# Patient Record
Sex: Male | Born: 1970 | Race: White | Hispanic: No | Marital: Married | Smoking: Never smoker
Health system: Southern US, Community
[De-identification: ages and names within clinical notes are randomized; demographics above are authoritative.]

## PROBLEM LIST (undated history)

## (undated) DIAGNOSIS — K469 Unspecified abdominal hernia without obstruction or gangrene: Secondary | ICD-10-CM

## (undated) DIAGNOSIS — I10 Essential (primary) hypertension: Secondary | ICD-10-CM

## (undated) HISTORY — DX: Unspecified abdominal hernia without obstruction or gangrene: K46.9

## (undated) HISTORY — DX: Essential (primary) hypertension: I10

## (undated) HISTORY — PX: OTHER SURGICAL HISTORY: SHX169

---

## 2017-02-05 ENCOUNTER — Encounter: Payer: Self-pay | Admitting: Nurse Practitioner

## 2017-02-05 ENCOUNTER — Ambulatory Visit (INDEPENDENT_AMBULATORY_CARE_PROVIDER_SITE_OTHER): Payer: 59 | Admitting: Nurse Practitioner

## 2017-02-05 VITALS — BP 120/80 | HR 58 | Temp 97.8°F | Ht 70.5 in | Wt 207.0 lb

## 2017-02-05 DIAGNOSIS — I10 Essential (primary) hypertension: Secondary | ICD-10-CM

## 2017-02-05 DIAGNOSIS — H539 Unspecified visual disturbance: Secondary | ICD-10-CM

## 2017-02-05 DIAGNOSIS — Z0000 Encounter for general adult medical examination without abnormal findings: Secondary | ICD-10-CM

## 2017-02-05 DIAGNOSIS — Z13220 Encounter for screening for lipoid disorders: Secondary | ICD-10-CM

## 2017-02-05 DIAGNOSIS — Z136 Encounter for screening for cardiovascular disorders: Secondary | ICD-10-CM | POA: Diagnosis not present

## 2017-02-05 MED ORDER — LISINOPRIL 10 MG PO TABS: 10.0000 mg | ORAL_TABLET | Freq: Every day | ORAL | 1 refills | Status: DC

## 2017-02-05 NOTE — Progress Notes (Signed)
Subjective:    Patient ID: Roy Regal., male    DOB: Apr 20, 1971, 46 y.o.   MRN: [id number]  Patient presents today for complete physical (new patient)   Eye Problem   The right (he has experienced transient episodes of vision loss, last about 5seconds,.) eye is affected. This is a recurrent problem. The current episode started more than 1 month ago. The problem occurs intermittently. The problem has been waxing and waning. There was no injury mechanism. There is no known exposure to pink eye. He does not wear contacts. Pertinent negatives include no blurred vision, eye discharge, double vision, eye redness, fever, foreign body sensation, itching, nausea, photophobia, recent URI or vomiting. He has tried nothing for the symptoms.  no FHX of blindness or macular degeneration.  Married Moved from [place]. Previous pcp in [place] was Dr. Simona Huh. Last seen over 1year ago.  HTN: Current use of lisinopril. Stable  Immunizations: (TDAP, Hep C screen, Pneumovax, Influenza, zoster)  Health Maintenance  Topic Date Due  . HIV Screening  01/20/1984  . Tetanus Vaccine  01/20/1988  . Flu Shot  04/03/2017   Diet:regular.  Weight:  Wt Readings from Last 3 Encounters:  02/05/17 207 lb (93.9 kg)    Exercise:daily running, weight lifting.  Fall Risk: Fall Risk  02/05/2017  Falls in the past year? No   Home Safety:home with wife and children.  Depression/Suicide: Depression screen [facility] 2/9 02/05/2017  Decreased Interest 0  Down, Depressed, Hopeless 0  PHQ - 2 Score 0   No flowsheet data found. Vision:needed. Has intermittent vision loss in right eye. Hx of laser surgery.  Dental:needed, will schedule.  Sexual History (birth control, marital status, STD):married and sexually active.  Medications and allergies reviewed with patient and updated if appropriate.  Patient Active Problem List   Diagnosis Date Noted  . HTN (hypertension) 02/05/2017    No current outpatient prescriptions on  file prior to visit.   No current facility-administered medications on file prior to visit.     Past Medical History:  Diagnosis Date  . Hypertension     History reviewed. No pertinent surgical history.  Social History   Social History  . Marital status: Married    Spouse name: N/A  . Number of children: N/A  . Years of education: N/A   Social History Main Topics  . Smoking status: Never Smoker  . Smokeless tobacco: Never Used  . Alcohol use Yes     Comment: social  . Drug use: No  . Sexual activity: Yes   Other Topics Concern  . None   Social History Narrative  . None    Family History  Problem Relation Age of Onset  . Benign prostatic hyperplasia Father   . Aneurysm Maternal Grandmother   . Alcohol abuse Paternal Grandfather   . COPD Paternal Grandfather         Review of Systems  Constitutional: Negative for fever, malaise/fatigue and weight loss.  HENT: Negative for congestion and sore throat.   Eyes: Negative for blurred vision, double vision, photophobia, discharge, redness and itching.       Negative for visual changes  Respiratory: Negative for cough and shortness of breath.   Cardiovascular: Negative for chest pain, palpitations and leg swelling.  Gastrointestinal: Negative for blood in stool, constipation, diarrhea, heartburn, nausea and vomiting.  Genitourinary: Negative for dysuria, frequency and urgency.  Musculoskeletal: Negative for falls, joint pain and myalgias.  Skin: Negative for rash.  Neurological: Negative for dizziness,  sensory change, speech change, focal weakness, seizures, loss of consciousness and headaches.  Endo/Heme/Allergies: Does not bruise/bleed easily.  Psychiatric/Behavioral: Negative for depression, substance abuse and suicidal ideas. The patient is not nervous/anxious and does not have insomnia.     Objective:   Vitals:   02/05/17 0932  BP: 120/80  Pulse: (!) 58  Temp: 97.8 F (36.6 C)    Body mass index is  29.28 kg/m.  ECG: Sinus Bradycardia with early repolarization, no ST segment or T wave abnormality. No previous ECG to compare.  Physical Examination:  Physical Exam  Constitutional: He is oriented to person, place, and time and well-developed, well-nourished, and in no distress. No distress.  HENT:  Right Ear: External ear normal.  Left Ear: External ear normal.  Nose: Nose normal.  Mouth/Throat: Oropharynx is clear and moist. No oropharyngeal exudate.  Eyes: Conjunctivae and EOM are normal. Pupils are equal, round, and reactive to light. Right eye exhibits no discharge. Left eye exhibits no discharge. No scleral icterus.  Neck: Normal range of motion. Neck supple. No thyromegaly present.  Cardiovascular: Normal rate, regular rhythm and normal heart sounds.   Pulmonary/Chest: Effort normal and breath sounds normal. He exhibits no tenderness.  Abdominal: Soft. Bowel sounds are normal. He exhibits no distension. There is no tenderness.  Musculoskeletal: Normal range of motion. He exhibits no edema or tenderness.  Lymphadenopathy:    He has no cervical adenopathy.  Neurological: He is alert and oriented to person, place, and time. Gait normal.  Skin: Skin is warm and dry.  Psychiatric: Affect and judgment normal.  Vitals reviewed.   ASSESSMENT and PLAN:  Silus was seen today for establish care.  Diagnoses and all orders for this visit:  Preventative health care -     Comprehensive metabolic panel; Future -     TSH; Future -     CBC; Future -     Lipid panel; Future -     EKG 12-Lead  Essential hypertension -     lisinopril (PRINIVIL,ZESTRIL) 10 MG tablet; Take 1 tablet (10 mg total) by mouth daily.  Encounter for lipid screening for cardiovascular disease -     Lipid panel; Future  Transient vision disturbance of right eye -     Ambulatory referral to Ophthalmology   No problem-specific Assessment & Plan notes found for this encounter.     Follow up: Return if  symptoms worsen or fail to improve.  Wilfred Lacy, NP

## 2017-02-05 NOTE — Patient Instructions (Addendum)
Please sign medical release to get records from previous pcp.  Please return to lab fasting at least 6-8hrs prior to blood draw. You will be contacted with results.  You will contacted to schedule appt with opthalmology.

## 2017-02-19 ENCOUNTER — Encounter: Payer: Self-pay | Admitting: Nurse Practitioner

## 2017-02-19 ENCOUNTER — Other Ambulatory Visit: Payer: Self-pay | Admitting: Nurse Practitioner

## 2017-02-19 DIAGNOSIS — Z136 Encounter for screening for cardiovascular disorders: Secondary | ICD-10-CM

## 2017-02-19 DIAGNOSIS — I10 Essential (primary) hypertension: Secondary | ICD-10-CM

## 2017-02-19 DIAGNOSIS — H34211 Partial retinal artery occlusion, right eye: Secondary | ICD-10-CM | POA: Insufficient documentation

## 2017-02-19 DIAGNOSIS — H539 Unspecified visual disturbance: Secondary | ICD-10-CM

## 2017-02-21 ENCOUNTER — Telehealth: Payer: Self-pay | Admitting: Nurse Practitioner

## 2017-02-21 NOTE — Telephone Encounter (Signed)
Rec'd from [employer] forward 30 pages to [place] NP

## 2017-03-05 ENCOUNTER — Encounter: Payer: Self-pay | Admitting: *Deleted

## 2017-03-06 NOTE — Progress Notes (Deleted)
   Cardiology Office Note    Date:  03/06/2017   ID:  Roy Regal., DOB August 06, 1971, MRN [id number]  PCP:  Flossie Buffy, NP  Cardiologist:  ***   No chief complaint on file.   History of Present Illness:  Roy Pridgeon. is a 46 y.o. male with past medical history of hypertension who recently had transient vision loss. 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  Carotid US Yes EKG  Past Medical History:  Diagnosis Date  . Hypertension     No past surgical history on file.  Current Medications: Outpatient Medications Prior to Visit  Medication Sig Dispense Refill  . lisinopril (PRINIVIL,ZESTRIL) 10 MG tablet Take 1 tablet (10 mg total) by mouth daily. 90 tablet 1   No facility-administered medications prior to visit.      Allergies:   Patient has no known allergies.   Social History   Social History  . Marital status: Married    Spouse name: N/A  . Number of children: N/A  . Years of education: N/A   Social History Main Topics  . Smoking status: Never Smoker  . Smokeless tobacco: Never Used  . Alcohol use Yes     Comment: social  . Drug use: No  . Sexual activity: Yes   Other Topics Concern  . Not on file   Social History Narrative  . No narrative on file     Family History:  The patient's ***family history includes Alcohol abuse in his paternal grandfather; Aneurysm in his maternal grandmother; Benign prostatic hyperplasia in his father; COPD in his paternal grandfather.   ROS:   Please see the history of present illness.    ROS All other systems reviewed and are negative.   PHYSICAL EXAM:   VS:  There were no vitals taken for this visit.   GEN: Well nourished, well developed, in no acute distress  HEENT: normal  Neck: no JVD, carotid bruits, or masses Cardiac: ***RRR; no murmurs, rubs, or gallops,no edema  Respiratory:  clear to auscultation bilaterally, normal work of breathing GI:  soft, nontender, nondistended, + BS MS: no deformity or atrophy  Skin: warm and dry, no rash Neuro:  Alert and Oriented x 3, Strength and sensation are intact Psych: euthymic mood, full affect  Wt Readings from Last 3 Encounters:  02/05/17 207 lb (93.9 kg)      Studies/Labs Reviewed:   EKG:  EKG is*** ordered today.  The ekg ordered today demonstrates ***  Recent Labs: No results found for requested labs within last 8760 hours.   Lipid Panel No results found for: CHOL, TRIG, HDL, CHOLHDL, VLDL, LDLCALC, LDLDIRECT  Additional studies/ records that were reviewed today include:  ***    ASSESSMENT:    No diagnosis found.   PLAN:  In order of problems listed above:  1. ***    Medication Adjustments/Labs and Tests Ordered: Current medicines are reviewed at length with the patient today.  Concerns regarding medicines are outlined above.  Medication changes, Labs and Tests ordered today are listed in the Patient Instructions below. There are no Patient Instructions on file for this visit.   Hilbert Corrigan, [place]  03/06/2017 10:34 PM    [facility] [address], [place], [place]  [address] Phone: [phone]; Fax: [phone]

## 2017-03-07 ENCOUNTER — Ambulatory Visit: Payer: 59 | Admitting: Physician Assistant

## 2017-03-07 ENCOUNTER — Ambulatory Visit (INDEPENDENT_AMBULATORY_CARE_PROVIDER_SITE_OTHER): Payer: 59 | Admitting: Cardiology

## 2017-03-07 ENCOUNTER — Encounter: Payer: Self-pay | Admitting: Cardiology

## 2017-03-07 ENCOUNTER — Encounter (INDEPENDENT_AMBULATORY_CARE_PROVIDER_SITE_OTHER): Payer: Self-pay

## 2017-03-07 VITALS — BP 144/72 | HR 67 | Ht 70.5 in | Wt 208.0 lb

## 2017-03-07 DIAGNOSIS — I10 Essential (primary) hypertension: Secondary | ICD-10-CM | POA: Diagnosis not present

## 2017-03-07 DIAGNOSIS — R9431 Abnormal electrocardiogram [ECG] [EKG]: Secondary | ICD-10-CM

## 2017-03-07 NOTE — Patient Instructions (Signed)
Medication Instructions:  Your physician recommends that you continue on your current medications as directed. Please refer to the Current Medication list given to you today.  Labwork: NONE  Testing/Procedures: Your physician has ordered a CT coronary calcium score. This test is done at [address].   Coronary CalciumScan A coronary calcium scan is an imaging test used to look for deposits of calcium and other fatty materials (plaques) in the inner lining of the blood vessels of the heart (coronary arteries). These deposits of calcium and plaques can partly clog and narrow the coronary arteries without producing any symptoms or warning signs. This puts a person at risk for a heart attack. This test can detect these deposits before symptoms develop. Tell a health care provider about:  Any allergies you have.  All medicines you are taking, including vitamins, herbs, eye drops, creams, and over-the-counter medicines.  Any problems you or family members have had with anesthetic medicines.  Any blood disorders you have.  Any surgeries you have had.  Any medical conditions you have.  Whether you are pregnant or may be pregnant. What are the risks? Generally, this is a safe procedure. However, problems may occur, including:  Harm to a pregnant woman and her unborn baby. This test involves the use of radiation. Radiation exposure can be dangerous to a pregnant woman and her unborn baby. If you are pregnant, you generally should not have this procedure done.  Slight increase in the risk of cancer. This is because of the radiation involved in the test. What happens before the procedure? No preparation is needed for this procedure. What happens during the procedure?  You will undress and remove any jewelry around your neck or chest.  You will put on a hospital gown.  Sticky electrodes will be placed on your chest. The electrodes will be connected to an  electrocardiogram (ECG) machine to record a tracing of the electrical activity of your heart.  A CT scanner will take pictures of your heart. During this time, you will be asked to lie still and hold your breath for 2-3 seconds while a picture of your heart is being taken. The procedure may vary among health care providers and hospitals. What happens after the procedure?  You can get dressed.  You can return to your normal activities.  It is up to you to get the results of your test. Ask your health care provider, or the department that is doing the test, when your results will be ready. Summary  A coronary calcium scan is an imaging test used to look for deposits of calcium and other fatty materials (plaques) in the inner lining of the blood vessels of the heart (coronary arteries).  Generally, this is a safe procedure. Tell your health care provider if you are pregnant or may be pregnant.  No preparation is needed for this procedure.  A CT scanner will take pictures of your heart.  You can return to your normal activities after the scan is done. This information is not intended to replace advice given to you by your health care provider. Make sure you discuss any questions you have with your health care provider. Document Released: 02/16/2008 Document Revised: 07/09/2016 Document Reviewed: 07/09/2016 Elsevier Interactive Patient Education  2017 [place]: AS NEEDED  Any Other Special Instructions Will Be Listed Below (If Applicable).     If you need a refill on your cardiac medications before your next appointment, please  call your pharmacy.

## 2017-03-07 NOTE — Progress Notes (Signed)
Cardiology Office Note   Date:  03/07/2017   ID:  [name]., DOB 13-May-1971, MRN [id number]  PCP:  Flossie Buffy, NP  Cardiologist:   Minus Breeding, MD  Referring:  Flossie Buffy, NP  Chief Complaint  Patient presents with  . Abnormal ECG      History of Present Illness: Roy Bauer. is a 46 y.o. male who presents for evaluation of an abnormal EKG.  He is referred by Flossie Buffy, NP.   He moved here from [place]. He works for the [employer] He has not had any prior cardiac history. He was noted to have an abnormal EKG with inferior Q waves and some voltage criteria for hypertrophy. However, have no old EKG for comparison. He has no symptoms. He doesn't report any prior cardiac testing. He exercises and actually ran 5 miles yesterday without symptoms. The patient denies any new symptoms such as chest discomfort, neck or arm discomfort. There has been no new shortness of breath, PND or orthopnea. There have been no reported palpitations, presyncope or syncope.    Past Medical History:  Diagnosis Date  . Hypertension     Past Surgical History:  Procedure Laterality Date  . None       Current Outpatient Prescriptions  Medication Sig Dispense Refill  . lisinopril (PRINIVIL,ZESTRIL) 10 MG tablet Take 1 tablet (10 mg total) by mouth daily. 90 tablet 1   No current facility-administered medications for this visit.     Allergies:   Patient has no known allergies.    Social History:  The patient  reports that he has never smoked. He has never used smokeless tobacco. He reports that he drinks alcohol. He reports that he does not use drugs.   Family History:  The patient's family history includes Alcohol abuse in his paternal grandfather; Aneurysm in his maternal grandmother; Benign prostatic hyperplasia in his father; COPD in his paternal grandfather; Heart disease in his mother.    ROS:  Please see the history of present illness.    Otherwise, review of systems are positive for insomnia.   All other systems are reviewed and negative.    PHYSICAL EXAM: VS:  BP (!) 144/72   Pulse 67   Ht 5' 10.5" (1.791 m)   Wt 208 lb (94.3 kg)   BMI 29.42 kg/m  , BMI Body mass index is 29.42 kg/m. GENERAL:  Well appearing HEENT:  Pupils equal round and reactive, fundi not visualized, oral mucosa unremarkable NECK:  No jugular venous distention, waveform within normal limits, carotid upstroke brisk and symmetric, no bruits, no thyromegaly LYMPHATICS:  No cervical, inguinal adenopathy LUNGS:  Clear to auscultation bilaterally BACK:  No CVA tenderness CHEST:  Unremarkable HEART:  PMI not displaced or sustained,S1 and S2 within normal limits, no S3, no S4, no clicks, no rubs, no murmurs ABD:  Flat, positive bowel sounds normal in frequency in pitch, no bruits, no rebound, no guarding, no midline pulsatile mass, no hepatomegaly, no splenomegaly EXT:  2 plus pulses throughout, no edema, no cyanosis no clubbing SKIN:  No rashes no nodules NEURO:  Cranial nerves II through XII grossly intact, motor grossly intact throughout PSYCH:  Cognitively intact, oriented to person place and time    EKG:  EKG is ordered today. The ekg ordered today demonstrates sinus rhythm, rate 67, axis within normal limits, intervals within normal limits, inferior and lateral Q waves not meeting criteria for pathologic Q waves, minimal voltage criteria  for LVH.   Recent Labs: No results found for requested labs within last 8760 hours.    Lipid Panel No results found for: CHOL, TRIG, HDL, CHOLHDL, VLDL, LDLCALC, LDLDIRECT    Wt Readings from Last 3 Encounters:  03/07/17 208 lb (94.3 kg)  02/05/17 207 lb (93.9 kg)      Other studies Reviewed: Additional studies/ records that were reviewed today include: None. Review of the above records demonstrates:  Please see elsewhere in the note.     ASSESSMENT AND PLAN:   HTN:  The blood pressure is at  target. No change in medications is indicated. We will continue with therapeutic lifestyle changes (TLC).  ABNORMAL EKG:  I think the pretest probability of obstructive coronary disease is low and I doubt that this represents significant pathology. He will get me old EKG for comparison. For screening purposes I will will order coronary calcium.  OVERWEIGHT:  His body mass index is slightly elevated and we discussed diet and exercise. I gave him specific instructions for this.    Current medicines are reviewed at length with the patient today.  The patient does not have concerns regarding medicines.  The following changes have been made:  no change  Labs/ tests ordered today include:   Orders Placed This Encounter  Procedures  . CT CARDIAC SCORING  . EKG 12-Lead     Disposition:   FU with me as needed.     Signed, Minus Breeding, MD  03/07/2017 9:24 PM    [place]

## 2017-03-13 ENCOUNTER — Ambulatory Visit (INDEPENDENT_AMBULATORY_CARE_PROVIDER_SITE_OTHER)
Admission: RE | Admit: 2017-03-13 | Discharge: 2017-03-13 | Disposition: A | Discharge status: Home / Self Care | Payer: Self-pay | Source: Ambulatory Visit | Attending: Cardiology | Admitting: Cardiology

## 2017-03-13 ENCOUNTER — Other Ambulatory Visit (INDEPENDENT_AMBULATORY_CARE_PROVIDER_SITE_OTHER): Payer: 59

## 2017-03-13 DIAGNOSIS — R9431 Abnormal electrocardiogram [ECG] [EKG]: Secondary | ICD-10-CM

## 2017-03-13 DIAGNOSIS — Z0000 Encounter for general adult medical examination without abnormal findings: Secondary | ICD-10-CM

## 2017-03-13 DIAGNOSIS — Z13220 Encounter for screening for lipoid disorders: Secondary | ICD-10-CM | POA: Diagnosis not present

## 2017-03-13 DIAGNOSIS — Z136 Encounter for screening for cardiovascular disorders: Secondary | ICD-10-CM | POA: Diagnosis not present

## 2017-03-13 LAB — COMPREHENSIVE METABOLIC PANEL
ALT: 22 U/L (ref 0–53)
AST: 20 U/L (ref 0–37)
Albumin: 4.5 g/dL (ref 3.5–5.2)
Alkaline Phosphatase: 39 U/L (ref 39–117)
BUN: 19 mg/dL (ref 6–23)
CHLORIDE: 103 meq/L (ref 96–112)
CO2: 27 meq/L (ref 19–32)
Calcium: 9.5 mg/dL (ref 8.4–10.5)
Creatinine, Ser: 1.23 mg/dL (ref 0.40–1.50)
GFR: 66.71 mL/min (ref >60.00)
GLUCOSE: 100 mg/dL — AB (ref 70–99)
POTASSIUM: 4 meq/L (ref 3.5–5.1)
SODIUM: 139 meq/L (ref 135–145)
TOTAL PROTEIN: 7.2 g/dL (ref 6.0–8.3)
Total Bilirubin: 0.7 mg/dL (ref 0.2–1.2)

## 2017-03-13 LAB — CBC
HEMATOCRIT: 44.8 % (ref 39.0–52.0)
HEMOGLOBIN: 15.4 g/dL (ref 13.0–17.0)
MCHC: 34.4 g/dL (ref 30.0–36.0)
MCV: 88 fl (ref 78.0–100.0)
PLATELETS: 324 10*3/uL (ref 150.0–400.0)
RBC: 5.09 Mil/uL (ref 4.22–5.81)
RDW: 12.4 % (ref 11.5–15.5)
WBC: 6.8 10*3/uL (ref 4.0–10.5)

## 2017-03-13 LAB — LIPID PANEL
CHOL/HDL RATIO: 4
Cholesterol: 166 mg/dL (ref 0–200)
HDL: 44 mg/dL (ref >39.00)
LDL CALC: 106 mg/dL — AB (ref 0–99)
NONHDL: 121.67
Triglycerides: 76 mg/dL (ref 0.0–149.0)
VLDL: 15.2 mg/dL (ref 0.0–40.0)

## 2017-03-13 LAB — TSH: TSH: 4.7 u[IU]/mL — ABNORMAL HIGH (ref 0.35–4.50)

## 2017-03-14 ENCOUNTER — Telehealth: Payer: Self-pay | Admitting: Nurse Practitioner

## 2017-03-14 NOTE — Telephone Encounter (Signed)
Pt would like results from yesterdays lab work

## 2017-03-15 NOTE — Telephone Encounter (Signed)
Pt is aware.  

## 2017-09-23 ENCOUNTER — Other Ambulatory Visit: Payer: Self-pay | Admitting: Nurse Practitioner

## 2017-09-23 DIAGNOSIS — I10 Essential (primary) hypertension: Secondary | ICD-10-CM

## 2018-01-06 DIAGNOSIS — T7840XA Allergy, unspecified, initial encounter: Secondary | ICD-10-CM | POA: Diagnosis not present

## 2018-01-06 DIAGNOSIS — J029 Acute pharyngitis, unspecified: Secondary | ICD-10-CM | POA: Diagnosis not present

## 2018-01-06 DIAGNOSIS — R05 Cough: Secondary | ICD-10-CM | POA: Diagnosis not present

## 2018-03-14 ENCOUNTER — Encounter: Payer: Self-pay | Admitting: Nurse Practitioner

## 2018-03-14 ENCOUNTER — Ambulatory Visit (INDEPENDENT_AMBULATORY_CARE_PROVIDER_SITE_OTHER): Payer: 59 | Admitting: Nurse Practitioner

## 2018-03-14 VITALS — BP 112/84 | HR 64 | Temp 97.4°F | Ht 70.5 in | Wt 205.0 lb

## 2018-03-14 DIAGNOSIS — I10 Essential (primary) hypertension: Secondary | ICD-10-CM | POA: Diagnosis not present

## 2018-03-14 DIAGNOSIS — Z13220 Encounter for screening for lipoid disorders: Secondary | ICD-10-CM

## 2018-03-14 DIAGNOSIS — S39012A Strain of muscle, fascia and tendon of lower back, initial encounter: Secondary | ICD-10-CM

## 2018-03-14 DIAGNOSIS — Z136 Encounter for screening for cardiovascular disorders: Secondary | ICD-10-CM

## 2018-03-14 DIAGNOSIS — Z23 Encounter for immunization: Secondary | ICD-10-CM | POA: Diagnosis not present

## 2018-03-14 DIAGNOSIS — Z0000 Encounter for general adult medical examination without abnormal findings: Secondary | ICD-10-CM | POA: Diagnosis not present

## 2018-03-14 DIAGNOSIS — Z1211 Encounter for screening for malignant neoplasm of colon: Secondary | ICD-10-CM

## 2018-03-14 LAB — CBC
HEMATOCRIT: 48.9 % (ref 39.0–52.0)
HEMOGLOBIN: 16.5 g/dL (ref 13.0–17.0)
MCHC: 33.8 g/dL (ref 30.0–36.0)
MCV: 87.1 fl (ref 78.0–100.0)
Platelets: 310 10*3/uL (ref 150.0–400.0)
RBC: 5.62 Mil/uL (ref 4.22–5.81)
RDW: 13.1 % (ref 11.5–15.5)
WBC: 10.6 10*3/uL — AB (ref 4.0–10.5)

## 2018-03-14 LAB — COMPREHENSIVE METABOLIC PANEL
ALT: 33 U/L (ref 0–53)
AST: 28 U/L (ref 0–37)
Albumin: 4.9 g/dL (ref 3.5–5.2)
Alkaline Phosphatase: 47 U/L (ref 39–117)
BILIRUBIN TOTAL: 1.6 mg/dL — AB (ref 0.2–1.2)
BUN: 16 mg/dL (ref 6–23)
CALCIUM: 9.9 mg/dL (ref 8.4–10.5)
CHLORIDE: 100 meq/L (ref 96–112)
CO2: 27 meq/L (ref 19–32)
Creatinine, Ser: 1.27 mg/dL (ref 0.40–1.50)
GFR: 64.02 mL/min (ref >60.00)
GLUCOSE: 99 mg/dL (ref 70–99)
POTASSIUM: 4.4 meq/L (ref 3.5–5.1)
Sodium: 139 mEq/L (ref 135–145)
Total Protein: 7.8 g/dL (ref 6.0–8.3)

## 2018-03-14 LAB — LIPID PANEL
CHOL/HDL RATIO: 4
Cholesterol: 191 mg/dL (ref 0–200)
HDL: 44.7 mg/dL (ref >39.00)
LDL CALC: 124 mg/dL — AB (ref 0–99)
NONHDL: 146.72
TRIGLYCERIDES: 116 mg/dL (ref 0.0–149.0)
VLDL: 23.2 mg/dL (ref 0.0–40.0)

## 2018-03-14 LAB — TSH: TSH: 2.39 u[IU]/mL (ref 0.35–4.50)

## 2018-03-14 LAB — IFOBT (OCCULT BLOOD): IFOBT: NEGATIVE

## 2018-03-14 MED ORDER — LISINOPRIL 10 MG PO TABS: 10.0000 mg | ORAL_TABLET | Freq: Every day | ORAL | 3 refills | Status: DC

## 2018-03-14 NOTE — Patient Instructions (Addendum)
Lipid panel indicates elevation in LDL. This can improve with heart healthy diet and regular exercise. Normal CMP, TSH, and CBC. Use tylenol or ibuprofen for back pain. Stretch before and after exercise, Maintain proper body mechanics to minimize injury. Return to office if back pain worsens or if develops any new symptoms.   Health Maintenance, Male A healthy lifestyle and preventive care is important for your health and wellness. Ask your health care provider about what schedule of regular examinations is right for you. What should I know about weight and diet? Eat a Healthy Diet  Eat plenty of vegetables, fruits, whole grains, low-fat dairy products, and lean protein.  Do not eat a lot of foods high in solid fats, added sugars, or salt.  Maintain a Healthy Weight Regular exercise can help you achieve or maintain a healthy weight. You should:  Do at least 150 minutes of exercise each week. The exercise should increase your heart rate and make you sweat (moderate-intensity exercise).  Do strength-training exercises at least twice a week.  Watch Your Levels of Cholesterol and Blood Lipids  Have your blood tested for lipids and cholesterol every 5 years starting at 49 years of age. If you are at high risk for heart disease, you should start having your blood tested when you are 49 years old. You may need to have your cholesterol levels checked more often if: ? Your lipid or cholesterol levels are high. ? You are older than 49 years of age. ? You are at high risk for heart disease.  What should I know about cancer screening? Many types of cancers can be detected early and may often be prevented. Lung Cancer  You should be screened every year for lung cancer if: ? You are a current smoker who has smoked for at least 30 years. ? You are a former smoker who has quit within the past 15 years.  Talk to your health care provider about your screening options, when you should start  screening, and how often you should be screened.  Colorectal Cancer  Routine colorectal cancer screening usually begins at 49 years of age and should be repeated every 5-10 years until you are 49 years old. You may need to be screened more often if early forms of precancerous polyps or small growths are found. Your health care provider may recommend screening at an earlier age if you have risk factors for colon cancer.  Your health care provider may recommend using home test kits to check for hidden blood in the stool.  A small camera at the end of a tube can be used to examine your colon (sigmoidoscopy or colonoscopy). This checks for the earliest forms of colorectal cancer.  Prostate and Testicular Cancer  Depending on your age and overall health, your health care provider may do certain tests to screen for prostate and testicular cancer.  Talk to your health care provider about any symptoms or concerns you have about testicular or prostate cancer.  Skin Cancer  Check your skin from head to toe regularly.  Tell your health care provider about any new moles or changes in moles, especially if: ? There is a change in a mole's size, shape, or color. ? You have a mole that is larger than a pencil eraser.  Always use sunscreen. Apply sunscreen liberally and repeat throughout the day.  Protect yourself by wearing long sleeves, pants, a wide-brimmed hat, and sunglasses when outside.  What should I know about heart disease, diabetes,  and high blood pressure?  If you are 61-13 years of age, have your blood pressure checked every 3-5 years. If you are 51 years of age or older, have your blood pressure checked every year. You should have your blood pressure measured twice-once when you are at a hospital or clinic, and once when you are not at a hospital or clinic. Record the average of the two measurements. To check your blood pressure when you are not at a hospital or clinic, you can use: ? An  automated blood pressure machine at a pharmacy. ? A home blood pressure monitor.  Talk to your health care provider about your target blood pressure.  If you are between 78-72 years old, ask your health care provider if you should take aspirin to prevent heart disease.  Have regular diabetes screenings by checking your fasting blood sugar level. ? If you are at a normal weight and have a low risk for diabetes, have this test once every three years after the age of 82. ? If you are overweight and have a high risk for diabetes, consider being tested at a younger age or more often.  A one-time screening for abdominal aortic aneurysm (AAA) by ultrasound is recommended for men aged 6-75 years who are current or former smokers. What should I know about preventing infection? Hepatitis B If you have a higher risk for hepatitis B, you should be screened for this virus. Talk with your health care provider to find out if you are at risk for hepatitis B infection. Hepatitis C Blood testing is recommended for:  Everyone born from [birth year] through 1967.  Anyone with known risk factors for hepatitis C.  Sexually Transmitted Diseases (STDs)  You should be screened each year for STDs including gonorrhea and chlamydia if: ? You are sexually active and are younger than 49 years of age. ? You are older than 49 years of age and your health care provider tells you that you are at risk for this type of infection. ? Your sexual activity has changed since you were last screened and you are at an increased risk for chlamydia or gonorrhea. Ask your health care provider if you are at risk.  Talk with your health care provider about whether you are at high risk of being infected with HIV. Your health care provider may recommend a prescription medicine to help prevent HIV infection.  What else can I do?  Schedule regular health, dental, and eye exams.  Stay current with your vaccines (immunizations).  Do not use  any tobacco products, such as cigarettes, chewing tobacco, and e-cigarettes. If you need help quitting, ask your health care provider.  Limit alcohol intake to no more than 2 drinks per day. One drink equals 12 ounces of beer, 5 ounces of wine, or 1 ounces of hard liquor.  Do not use street drugs.  Do not share needles.  Ask your health care provider for help if you need support or information about quitting drugs.  Tell your health care provider if you often feel depressed.  Tell your health care provider if you have ever been abused or do not feel safe at home. This information is not intended to replace advice given to you by your health care provider. Make sure you discuss any questions you have with your health care provider. Document Released: 02/16/2008 Document Revised: 04/18/2016 Document Reviewed: 05/24/2015 Elsevier Interactive Patient Education  [employer].

## 2018-03-14 NOTE — Progress Notes (Signed)
Subjective:    Patient ID: Roy Regal., male    DOB: 19-Mar-1971, 47 y.o.   MRN: [id number]  Patient presents today for complete physical   Back Pain  This is a new problem. The current episode started 1 to 4 weeks ago. The problem occurs intermittently. The problem has been waxing and waning since onset. The pain is present in the lumbar spine. The quality of the pain is described as aching. The pain does not radiate. Pertinent negatives include no abdominal pain, bladder incontinence, bowel incontinence, chest pain, dysuria, fever, headaches, leg pain, numbness, paresis, paresthesias, pelvic pain, perianal numbness, tingling, weakness or weight loss. He has tried nothing for the symptoms.   HTN: Controlled with lisinopril. BP Readings from Last 3 Encounters:  03/14/18 112/84  03/07/17 (!) 144/72  02/05/17 120/80   Immunizations: (TDAP, Hep C screen, Pneumovax, Influenza, zoster)  Health Maintenance  Topic Date Due  . Tetanus Vaccine  01/20/1988  . HIV Screening  03/15/2019*  . Flu Shot  04/03/2018  *Topic was postponed. The date shown is not the original due date.   Diet:regular due to extensive traveling for work.  Weight:  Wt Readings from Last 3 Encounters:  03/14/18 205 lb (93 kg)  03/07/17 208 lb (94.3 kg)  02/05/17 207 lb (93.9 kg)   Exercise:2-3times a week, not consistent when traveling.  Fall Risk: Fall Risk  03/14/2018 02/05/2017  Falls in the past year? No No   Home Safety:home with wife and children  Depression/Suicide: Depression screen [facility] 2/9 03/14/2018 02/05/2017  Decreased Interest 0 0  Down, Depressed, Hopeless 0 0  PHQ - 2 Score 0 0   Vision:up to date.  Dental:up to date.  Medications and allergies reviewed with patient and updated if appropriate.  Patient Active Problem List   Diagnosis Date Noted  . Abnormal EKG 03/07/2017  . 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 Transient vision disturbance of right eye 02/19/2017  . HTN  (hypertension) 02/05/2017    No current outpatient medications on file prior to visit.   No current facility-administered medications on file prior to visit.     Past Medical History:  Diagnosis Date  . Hypertension     Past Surgical History:  Procedure Laterality Date  . None      Social History   Socioeconomic History  . Marital status: Married    Spouse name: Not on file  . Number of children: 3  . Years of education: Not on file  . Highest education level: Not on file  Occupational History  . Not on file  Social Needs  . Financial resource strain: Not on file  . Food insecurity:    Worry: Not on file    Inability: Not on file  . Transportation needs:    Medical: Not on file    Non-medical: Not on file  Tobacco Use  . Smoking status: Never Smoker  . Smokeless tobacco: Never Used  Substance and Sexual Activity  . Alcohol use: Yes    Comment: social  . Drug use: No  . Sexual activity: Yes    Birth control/protection: None  Lifestyle  . Physical activity:    Days per week: Not on file    Minutes per session: Not on file  . Stress: Not on file  Relationships  . Social connections:    Talks on phone: More than three times a week    Gets together: Three times a week    Attends religious  service: Not on file    Active member of club or organization: Yes    Attends meetings of clubs or organizations: 1 to 4 times per year    Relationship status: Married  Other Topics Concern  . Not on file  Social History Narrative  . Not on file    Family History  Problem Relation Age of Onset  . Heart disease Mother        Valve surgery for SBE  . Benign prostatic hyperplasia Father   . Aneurysm Maternal Grandmother   . Alcohol abuse Paternal Grandfather   . COPD Paternal Grandfather         Review of Systems  Constitutional: Negative for fever, malaise/fatigue and weight loss.  HENT: Negative.   Respiratory: Negative.   Cardiovascular: Negative.  Negative  for chest pain.  Gastrointestinal: Negative.  Negative for abdominal pain and bowel incontinence.  Genitourinary: Negative.  Negative for bladder incontinence, dysuria and pelvic pain.  Musculoskeletal: Positive for back pain.  Skin: Negative.   Neurological: Negative for tingling, sensory change, focal weakness, weakness, numbness, headaches and paresthesias.  Endo/Heme/Allergies: Negative.   Psychiatric/Behavioral: Negative.     Objective:   Vitals:   03/14/18 0842  BP: 112/84  Pulse: 64  Temp: (!) 97.4 F (36.3 C)  SpO2: 96%    Body mass index is 29 kg/m.   Physical Examination:  Physical Exam  Constitutional: He is oriented to person, place, and time. He appears well-developed and well-nourished.  HENT:  Right Ear: External ear normal.  Left Ear: External ear normal.  Nose: Nose normal.  Mouth/Throat: Oropharynx is clear and moist. No oropharyngeal exudate.  Eyes: Pupils are equal, round, and reactive to light. Conjunctivae and EOM are normal.  Neck: Normal range of motion. Neck supple. No thyromegaly present.  Cardiovascular: Normal rate, regular rhythm and normal heart sounds.  Pulmonary/Chest: Effort normal and breath sounds normal. No respiratory distress. He exhibits no tenderness.  Abdominal: Soft. Bowel sounds are normal. He exhibits no distension. There is no tenderness.  Genitourinary: Rectum normal and prostate normal. Rectal exam shows guaiac negative stool.  Musculoskeletal: Normal range of motion. He exhibits no edema, tenderness or deformity.  Lymphadenopathy:    He has no cervical adenopathy.  Neurological: He is alert and oriented to person, place, and time. He has normal reflexes.  Vitals reviewed.  ASSESSMENT and PLAN:  Hoa was seen today for annual exam and back pain.  Diagnoses and all orders for this visit:  Preventative health care -     CBC -     TSH -     Lipid panel -     Comprehensive metabolic panel -     IFOBT POC (occult bld,  rslt in office)  Colon cancer screening -     IFOBT POC (occult bld, rslt in office)  Encounter for lipid screening for cardiovascular disease -     Lipid panel  Need for diphtheria-tetanus-pertussis (Tdap) vaccine -     Tdap vaccine greater than or equal to 7yo IM  Essential hypertension -     lisinopril (PRINIVIL,ZESTRIL) 10 MG tablet; Take 1 tablet (10 mg total) by mouth daily. Need office visit for additional refills  Strain of lumbar paraspinous muscle, initial encounter   No problem-specific Assessment & Plan notes found for this encounter.     Follow up: Return if symptoms worsen or fail to improve.  Wilfred Lacy, NP

## 2019-02-13 ENCOUNTER — Other Ambulatory Visit: Payer: Self-pay

## 2019-02-13 ENCOUNTER — Encounter: Payer: Self-pay | Admitting: Nurse Practitioner

## 2019-02-13 ENCOUNTER — Encounter: Payer: Self-pay | Admitting: Gastroenterology

## 2019-02-13 ENCOUNTER — Ambulatory Visit (INDEPENDENT_AMBULATORY_CARE_PROVIDER_SITE_OTHER): Payer: 59 | Admitting: Nurse Practitioner

## 2019-02-13 VITALS — BP 140/82 | HR 74 | Temp 98.6°F | Ht 70.5 in | Wt 181.8 lb

## 2019-02-13 DIAGNOSIS — Z136 Encounter for screening for cardiovascular disorders: Secondary | ICD-10-CM

## 2019-02-13 DIAGNOSIS — Z125 Encounter for screening for malignant neoplasm of prostate: Secondary | ICD-10-CM | POA: Diagnosis not present

## 2019-02-13 DIAGNOSIS — I10 Essential (primary) hypertension: Secondary | ICD-10-CM | POA: Diagnosis not present

## 2019-02-13 DIAGNOSIS — Z1211 Encounter for screening for malignant neoplasm of colon: Secondary | ICD-10-CM

## 2019-02-13 DIAGNOSIS — Z0001 Encounter for general adult medical examination with abnormal findings: Secondary | ICD-10-CM | POA: Diagnosis not present

## 2019-02-13 DIAGNOSIS — Z13220 Encounter for screening for lipoid disorders: Secondary | ICD-10-CM | POA: Diagnosis not present

## 2019-02-13 LAB — IFOBT (OCCULT BLOOD): IFOBT: NEGATIVE

## 2019-02-13 NOTE — Progress Notes (Signed)
Subjective:    Patient ID: Roy Regal., male    DOB: 1971/04/06, 48 y.o.   MRN: [id number]  Patient presents today for complete physical and eval of HTN.  HPI  HTN: Reports BP 140s/80s during another OV with another provider. Complaint with lisinopril 10mg  Maintains DASH diet and regular exercise (3-4x/week, running 1hr) BP Readings from Last 3 Encounters:  02/13/19 140/82  03/14/18 112/84  03/07/17 (!) 144/72   Intentional weight loss in last 1year through diet and exercise. Wt Readings from Last 3 Encounters:  02/13/19 181 lb 12.8 oz (82.5 kg)  03/14/18 205 lb (93 kg)  03/07/17 208 lb (94.3 kg)   Sexual History (orientation,birth control, marital status, STD):married, sexually active  Depression/Suicide: Depression screen [facility] 2/9 02/13/2019 03/14/2018 02/05/2017  Decreased Interest 0 0 0  Down, Depressed, Hopeless 0 0 0  PHQ - 2 Score 0 0 0   Vision:not needed per patient, last done 32yrs ago, no FHx of glaucoma or cataracts  Dental:up to date  Immunizations: (TDAP, Hep C screen, Pneumovax, Influenza, zoster)  Health Maintenance  Topic Date Due  . Colon Cancer Screening  01/20/2019  . HIV Screening  03/15/2019*  . Flu Shot  04/04/2019  . Tetanus Vaccine  03/14/2028  *Topic was postponed. The date shown is not the original due date.   Fall Risk: Fall Risk  02/13/2019 03/14/2018 02/05/2017  Falls in the past year? 0 No No   Medications and allergies reviewed with patient and updated if appropriate.  Patient Active Problem List   Diagnosis Date Noted  . Abnormal EKG 03/07/2017  . 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 Transient vision disturbance of right eye 02/19/2017  . HTN (hypertension) 02/05/2017   Current Outpatient Medications on File Prior to Visit  Medication Sig Dispense Refill  . lisinopril (PRINIVIL,ZESTRIL) 10 MG tablet Take 1 tablet (10 mg total) by mouth daily. Need office visit for additional refills 90 tablet 3   No current  facility-administered medications on file prior to visit.     Past Medical History:  Diagnosis Date  . Hernia, abdominal   . Hypertension     Past Surgical History:  Procedure Laterality Date  . None      Social History   Socioeconomic History  . Marital status: Married    Spouse name: Not on file  . Number of children: 3  . Years of education: Not on file  . Highest education level: Not on file  Occupational History  . Not on file  Social Needs  . Financial resource strain: Not on file  . Food insecurity    Worry: Not on file    Inability: Not on file  . Transportation needs    Medical: Not on file    Non-medical: Not on file  Tobacco Use  . Smoking status: Never Smoker  . Smokeless tobacco: Never Used  Substance and Sexual Activity  . Alcohol use: Yes    Comment: social  . Drug use: No  . Sexual activity: Yes    Birth control/protection: None  Lifestyle  . Physical activity    Days per week: Not on file    Minutes per session: Not on file  . Stress: Not on file  Relationships  . Social connections    Talks on phone: More than three times a week    Gets together: Three times a week    Attends religious service: Not on file    Active member of club or  organization: Yes    Attends meetings of clubs or organizations: 1 to 4 times per year    Relationship status: Married  Other Topics Concern  . Not on file  Social History Narrative  . Not on file    Family History  Problem Relation Age of Onset  . Heart disease Mother        Valve surgery for SBE  . Benign prostatic hyperplasia Father   . Aneurysm Maternal Grandmother   . Alcohol abuse Paternal Grandfather   . COPD Paternal Grandfather         Review of Systems  Constitutional: Negative for fever, malaise/fatigue and weight loss.  HENT: Negative for congestion and sore throat.   Eyes:       Negative for visual changes  Respiratory: Negative for cough and shortness of breath.    Cardiovascular: Negative for chest pain, palpitations and leg swelling.  Gastrointestinal: Negative for blood in stool, constipation, diarrhea and heartburn.  Genitourinary: Negative for dysuria, frequency and urgency.  Musculoskeletal: Negative for falls, joint pain and myalgias.  Skin: Negative for rash.  Neurological: Negative for dizziness, sensory change and headaches.  Endo/Heme/Allergies: Does not bruise/bleed easily.  Psychiatric/Behavioral: Negative for depression, substance abuse and suicidal ideas. The patient is not nervous/anxious.     Objective:   Vitals:   02/13/19 1429  BP: 140/82  Pulse: 74  Temp: 98.6 F (37 C)  SpO2: 96%    Body mass index is 25.72 kg/m.   Physical Examination:  Physical Exam Vitals signs reviewed. Exam conducted with a chaperone present.  Constitutional:      General: He is not in acute distress.    Appearance: Normal appearance.  HENT:     Right Ear: Tympanic membrane, ear canal and external ear normal.     Left Ear: Tympanic membrane, ear canal and external ear normal.     Nose: Nose normal.     Mouth/Throat:     Mouth: Mucous membranes are moist.     Pharynx: No oropharyngeal exudate.  Eyes:     General: No scleral icterus.    Extraocular Movements: Extraocular movements intact.     Conjunctiva/sclera: Conjunctivae normal.     Pupils: Pupils are equal, round, and reactive to light.  Neck:     Musculoskeletal: Normal range of motion and neck supple.     Thyroid: No thyromegaly.  Cardiovascular:     Rate and Rhythm: Normal rate and regular rhythm.     Heart sounds: Normal heart sounds.  Pulmonary:     Effort: Pulmonary effort is normal.     Breath sounds: Normal breath sounds.  Abdominal:     General: Bowel sounds are normal. There is no distension.     Palpations: Abdomen is soft.     Tenderness: There is no abdominal tenderness.  Genitourinary:    Penis: No discharge.      Prostate: Normal.     Rectum: Normal. Guaiac  result negative.  Musculoskeletal: Normal range of motion.        General: No tenderness.     Right lower leg: No edema.     Left lower leg: No edema.  Lymphadenopathy:     Cervical: No cervical adenopathy.  Skin:    General: Skin is warm and dry.  Neurological:     Mental Status: He is alert and oriented to person, place, and time.     Cranial Nerves: No cranial nerve deficit.     Deep Tendon Reflexes: Reflexes  are normal and symmetric.  Psychiatric:        Mood and Affect: Mood normal.        Behavior: Behavior normal.        Thought Content: Thought content normal.        Judgment: Judgment normal.    ASSESSMENT and PLAN:  Royal was seen today for annual exam.  Diagnoses and all orders for this visit:  Encounter for preventative adult health care exam with abnormal findings -     CBC; Future -     Comprehensive metabolic panel; Future -     Lipid panel; Future -     PSA; Future  Essential hypertension  Prostate cancer screening -     PSA; Future  Encounter for lipid screening for cardiovascular disease -     Lipid panel; Future  Colon cancer screening -     Ambulatory referral to Gastroenterology -     IFOBT POC (occult bld, rslt in office)    No problem-specific Assessment & Plan notes found for this encounter.      Problem List Items Addressed This Visit      Cardiovascular and Mediastinum   HTN (hypertension)    Other Visit Diagnoses    Encounter for preventative adult health care exam with abnormal findings    -  Primary   Relevant Orders   CBC   Comprehensive metabolic panel   Lipid panel   PSA   Prostate cancer screening       Relevant Orders   PSA   Encounter for lipid screening for cardiovascular disease       Relevant Orders   Lipid panel   Colon cancer screening       Relevant Orders   Ambulatory referral to Gastroenterology   IFOBT POC (occult bld, rslt in office) (Completed)       Follow up: Return if symptoms worsen or fail  to improve.  Wilfred Lacy, NP

## 2019-02-13 NOTE — Patient Instructions (Addendum)
Go to lab at [facility] office for blood draw. You will be contacted for appt with GI.  Sign up for mychart account. Check BP at home 2-3times a week. Send BP readings through mychart.  Health Maintenance, Male A healthy lifestyle and preventive care is important for your health and wellness. Ask your health care provider about what schedule of regular examinations is right for you. What should I know about weight and diet? Eat a Healthy Diet  Eat plenty of vegetables, fruits, whole grains, low-fat dairy products, and lean protein.  Do not eat a lot of foods high in solid fats, added sugars, or salt.  Maintain a Healthy Weight Regular exercise can help you achieve or maintain a healthy weight. You should:  Do at least 150 minutes of exercise each week. The exercise should increase your heart rate and make you sweat (moderate-intensity exercise).  Do strength-training exercises at least twice a week. Watch Your Levels of Cholesterol and Blood Lipids  Have your blood tested for lipids and cholesterol every 5 years starting at 50 years of age. If you are at high risk for heart disease, you should start having your blood tested when you are 50 years old. You may need to have your cholesterol levels checked more often if: ? Your lipid or cholesterol levels are high. ? You are older than 50 years of age. ? You are at high risk for heart disease. What should I know about cancer screening? Many types of cancers can be detected early and may often be prevented. Lung Cancer  You should be screened every year for lung cancer if: ? You are a current smoker who has smoked for at least 30 years. ? You are a former smoker who has quit within the past 15 years.  Talk to your health care provider about your screening options, when you should start screening, and how often you should be screened. Colorectal Cancer  Routine colorectal cancer screening usually begins at 50 years of age and should be  repeated every 5-10 years until you are 50 years old. You may need to be screened more often if early forms of precancerous polyps or small growths are found. Your health care provider may recommend screening at an earlier age if you have risk factors for colon cancer.  Your health care provider may recommend using home test kits to check for hidden blood in the stool.  A small camera at the end of a tube can be used to examine your colon (sigmoidoscopy or colonoscopy). This checks for the earliest forms of colorectal cancer. Prostate and Testicular Cancer  Depending on your age and overall health, your health care provider may do certain tests to screen for prostate and testicular cancer.  Talk to your health care provider about any symptoms or concerns you have about testicular or prostate cancer. Skin Cancer  Check your skin from head to toe regularly.  Tell your health care provider about any new moles or changes in moles, especially if: ? There is a change in a mole's size, shape, or color. ? You have a mole that is larger than a pencil eraser.  Always use sunscreen. Apply sunscreen liberally and repeat throughout the day.  Protect yourself by wearing long sleeves, pants, a wide-brimmed hat, and sunglasses when outside. What should I know about heart disease, diabetes, and high blood pressure?  If you are 79-61 years of age, have your blood pressure checked every 3-5 years. If you are 40 years  of age or older, have your blood pressure checked every year. You should have your blood pressure measured twice-once when you are at a hospital or clinic, and once when you are not at a hospital or clinic. Record the average of the two measurements. To check your blood pressure when you are not at a hospital or clinic, you can use: ? An automated blood pressure machine at a pharmacy. ? A home blood pressure monitor.  Talk to your health care provider about your target blood pressure.  If you  are between 70-31 years old, ask your health care provider if you should take aspirin to prevent heart disease.  Have regular diabetes screenings by checking your fasting blood sugar level. ? If you are at a normal weight and have a low risk for diabetes, have this test once every three years after the age of 36. ? If you are overweight and have a high risk for diabetes, consider being tested at a younger age or more often.  A one-time screening for abdominal aortic aneurysm (AAA) by ultrasound is recommended for men aged 36-75 years who are current or former smokers. What should I know about preventing infection? Hepatitis B If you have a higher risk for hepatitis B, you should be screened for this virus. Talk with your health care provider to find out if you are at risk for hepatitis B infection. Hepatitis C Blood testing is recommended for:  Everyone born from [birth year] through 1967.  Anyone with known risk factors for hepatitis C. Sexually Transmitted Diseases (STDs)  You should be screened each year for STDs including gonorrhea and chlamydia if: ? You are sexually active and are younger than 50 years of age. ? You are older than 50 years of age and your health care provider tells you that you are at risk for this type of infection. ? Your sexual activity has changed since you were last screened and you are at an increased risk for chlamydia or gonorrhea. Ask your health care provider if you are at risk.  Talk with your health care provider about whether you are at high risk of being infected with HIV. Your health care provider may recommend a prescription medicine to help prevent HIV infection. What else can I do?  Schedule regular health, dental, and eye exams.  Stay current with your vaccines (immunizations).  Do not use any tobacco products, such as cigarettes, chewing tobacco, and e-cigarettes. If you need help quitting, ask your health care provider.  Limit alcohol intake to no  more than 2 drinks per day. One drink equals 12 ounces of beer, 5 ounces of wine, or 1 ounces of hard liquor.  Do not use street drugs.  Do not share needles.  Ask your health care provider for help if you need support or information about quitting drugs.  Tell your health care provider if you often feel depressed.  Tell your health care provider if you have ever been abused or do not feel safe at home. This information is not intended to replace advice given to you by your health care provider. Make sure you discuss any questions you have with your health care provider. Document Released: 02/16/2008 Document Revised: 04/18/2016 Document Reviewed: 05/24/2015 Elsevier Interactive Patient Education  2019 [employer].

## 2019-03-02 ENCOUNTER — Encounter: Payer: 59 | Admitting: Nurse Practitioner

## 2019-03-12 ENCOUNTER — Other Ambulatory Visit (INDEPENDENT_AMBULATORY_CARE_PROVIDER_SITE_OTHER): Payer: 59

## 2019-03-12 DIAGNOSIS — Z125 Encounter for screening for malignant neoplasm of prostate: Secondary | ICD-10-CM

## 2019-03-12 DIAGNOSIS — I10 Essential (primary) hypertension: Secondary | ICD-10-CM

## 2019-03-12 DIAGNOSIS — Z136 Encounter for screening for cardiovascular disorders: Secondary | ICD-10-CM

## 2019-03-12 DIAGNOSIS — Z13220 Encounter for screening for lipoid disorders: Secondary | ICD-10-CM

## 2019-03-12 DIAGNOSIS — Z0001 Encounter for general adult medical examination with abnormal findings: Secondary | ICD-10-CM | POA: Diagnosis not present

## 2019-03-12 LAB — COMPREHENSIVE METABOLIC PANEL
ALT: 21 U/L (ref 0–53)
AST: 18 U/L (ref 0–37)
Albumin: 4.8 g/dL (ref 3.5–5.2)
Alkaline Phosphatase: 46 U/L (ref 39–117)
BUN: 23 mg/dL (ref 6–23)
CO2: 32 mEq/L (ref 19–32)
Calcium: 9.9 mg/dL (ref 8.4–10.5)
Chloride: 100 mEq/L (ref 96–112)
Creatinine, Ser: 1.13 mg/dL (ref 0.40–1.50)
GFR: 68.65 mL/min (ref >60.00)
Glucose, Bld: 87 mg/dL (ref 70–99)
Potassium: 4.2 mEq/L (ref 3.5–5.1)
Sodium: 140 mEq/L (ref 135–145)
Total Bilirubin: 1.1 mg/dL (ref 0.2–1.2)
Total Protein: 7 g/dL (ref 6.0–8.3)

## 2019-03-12 LAB — CBC
HCT: 44.8 % (ref 39.0–52.0)
Hemoglobin: 15.4 g/dL (ref 13.0–17.0)
MCHC: 34.3 g/dL (ref 30.0–36.0)
MCV: 88.8 fl (ref 78.0–100.0)
Platelets: 279 10*3/uL (ref 150.0–400.0)
RBC: 5.04 Mil/uL (ref 4.22–5.81)
RDW: 12.8 % (ref 11.5–15.5)
WBC: 7.2 10*3/uL (ref 4.0–10.5)

## 2019-03-12 LAB — LIPID PANEL
Cholesterol: 199 mg/dL (ref 0–200)
HDL: 47.8 mg/dL (ref >39.00)
LDL Cholesterol: 130 mg/dL — ABNORMAL HIGH (ref 0–99)
NonHDL: 150.86
Total CHOL/HDL Ratio: 4
Triglycerides: 105 mg/dL (ref 0.0–149.0)
VLDL: 21 mg/dL (ref 0.0–40.0)

## 2019-03-12 LAB — PSA: PSA: 0.7 ng/mL (ref 0.10–4.00)

## 2019-03-13 ENCOUNTER — Other Ambulatory Visit: Payer: Self-pay

## 2019-03-13 ENCOUNTER — Ambulatory Visit (AMBULATORY_SURGERY_CENTER): Payer: Self-pay | Admitting: *Deleted

## 2019-03-13 VITALS — Ht 70.0 in | Wt 180.0 lb

## 2019-03-13 DIAGNOSIS — Z1211 Encounter for screening for malignant neoplasm of colon: Secondary | ICD-10-CM

## 2019-03-13 MED ORDER — SUPREP BOWEL PREP KIT 17.5-3.13-1.6 GM/177ML PO SOLN: ORAL | 0 refills | Status: DC

## 2019-03-13 NOTE — Progress Notes (Signed)
Pt's previsit is done over the phone and all paperwork (prep instructions, blank consent form to just read over, pre-procedure acknowledgement form and stamped envelope) sent to patient.  No egg or soy allergy  No home oxygen use or problems with anesthesia  No medications for weight loss taken  emmi information given  $15 off Suprep given   Pt is aware that care partner will wait in the car during procedure; if they feel like they will be too hot to wait in the car; they may wait in the lobby.  We want them to wear a mask (we do not have any that we can provide them), practice social distancing, and we will check their temperatures when they get here.  I did remind patient that their care partner needs to stay in the parking lot the entire time. Pt will wear mask into building.

## 2019-03-26 ENCOUNTER — Telehealth: Payer: Self-pay | Admitting: Gastroenterology

## 2019-03-26 NOTE — Telephone Encounter (Signed)

## 2019-03-27 ENCOUNTER — Encounter: Payer: Self-pay | Admitting: Gastroenterology

## 2019-03-27 ENCOUNTER — Other Ambulatory Visit: Payer: Self-pay

## 2019-03-27 ENCOUNTER — Ambulatory Visit (AMBULATORY_SURGERY_CENTER): Payer: 59 | Admitting: Gastroenterology

## 2019-03-27 VITALS — BP 126/88 | HR 58 | Temp 98.2°F | Resp 15 | Ht 70.0 in | Wt 180.0 lb

## 2019-03-27 DIAGNOSIS — Z1211 Encounter for screening for malignant neoplasm of colon: Secondary | ICD-10-CM | POA: Diagnosis not present

## 2019-03-27 DIAGNOSIS — K635 Polyp of colon: Secondary | ICD-10-CM

## 2019-03-27 DIAGNOSIS — D120 Benign neoplasm of cecum: Secondary | ICD-10-CM

## 2019-03-27 MED ORDER — SODIUM CHLORIDE 0.9 % IV SOLN: 500.0000 mL | Freq: Once | INTRAVENOUS | Status: DC

## 2019-03-27 NOTE — Patient Instructions (Signed)
2 polyps removed. Hemorrhoids noted. Please read all handouts.    YOU HAD AN ENDOSCOPIC PROCEDURE TODAY AT [facility]:   Refer to the procedure report that was given to you for any specific questions about what was found during the examination.  If the procedure report does not answer your questions, please call your gastroenterologist to clarify.  If you requested that your care partner not be given the details of your procedure findings, then the procedure report has been included in a sealed envelope for you to review at your convenience later.  YOU SHOULD EXPECT: Some feelings of bloating in the abdomen. Passage of more gas than usual.  Walking can help get rid of the air that was put into your GI tract during the procedure and reduce the bloating. If you had a lower endoscopy (such as a colonoscopy or flexible sigmoidoscopy) you may notice spotting of blood in your stool or on the toilet paper. If you underwent a bowel prep for your procedure, you may not have a normal bowel movement for a few days.  Please Note:  You might notice some irritation and congestion in your nose or some drainage.  This is from the oxygen used during your procedure.  There is no need for concern and it should clear up in a day or so.  SYMPTOMS TO REPORT IMMEDIATELY:   Following lower endoscopy (colonoscopy or flexible sigmoidoscopy):  Excessive amounts of blood in the stool  Significant tenderness or worsening of abdominal pains  Swelling of the abdomen that is new, acute  Fever of 100F or higher  For urgent or emergent issues, a gastroenterologist can be reached at any hour by calling [phone].   DIET:  We do recommend a small meal at first, but then you may proceed to your regular diet.  Drink plenty of fluids but you should avoid alcoholic beverages for 24 hours.  ACTIVITY:  You should plan to take it easy for the rest of today and you should NOT DRIVE or use heavy machinery until  tomorrow (because of the sedation medicines used during the test).    FOLLOW UP: Our staff will call the number listed on your records 48-72 hours following your procedure to check on you and address any questions or concerns that you may have regarding the information given to you following your procedure. If we do not reach you, we will leave a message.  We will attempt to reach you two times.  During this call, we will ask if you have developed any symptoms of COVID 19. If you develop any symptoms (ie: fever, flu-like symptoms, shortness of breath, cough etc.) before then, please call [phone].  If you test positive for Covid 19 in the 2 weeks post procedure, please call and report this information to [place].    If any biopsies were taken you will be contacted by phone or by letter within the next 1-3 weeks.  Please call us at [phone] if you have not heard about the biopsies in 3 weeks.    SIGNATURES/CONFIDENTIALITY: You and/or your care partner have signed paperwork which will be entered into your electronic medical record.  These signatures attest to the fact that that the information above on your After Visit Summary has been reviewed and is understood.  Full responsibility of the confidentiality of this discharge information lies with you and/or your care-partner.

## 2019-03-27 NOTE — Progress Notes (Signed)
PT taken to PACU. Monitors in place. VSS. Report given to RN. 

## 2019-03-27 NOTE — Op Note (Signed)
[facility] Patient Name: Roy Bauer Procedure Date: 03/27/2019 10:54 AM MRN: [id number] Endoscopist: Thornton Park MD, MD Age: 48 Referring MD:  Date of Birth: 09-Sep-1970 Gender: Male Account #: [id number] Procedure:                Colonoscopy Indications:              Screening for colorectal malignant neoplasm, This                            is the patient's first colonoscopy                           No known family history of colon cancer or polyps                           No baseline GI symptoms Medicines:                See the Anesthesia note for documentation of the                            administered medications Procedure:                Pre-Anesthesia Assessment:                           - Prior to the procedure, a History and Physical                            was performed, and patient medications and                            allergies were reviewed. The patient's tolerance of                            previous anesthesia was also reviewed. The risks                            and benefits of the procedure and the sedation                            options and risks were discussed with the patient.                            All questions were answered, and informed consent                            was obtained. Prior Anticoagulants: The patient has                            taken no previous anticoagulant or antiplatelet                            agents. ASA Grade Assessment: II - A patient with  mild systemic disease. After reviewing the risks                            and benefits, the patient was deemed in                            satisfactory condition to undergo the procedure.                           After obtaining informed consent, the colonoscope                            was passed under direct vision. Throughout the                            procedure, the patient's blood pressure, pulse, and                           oxygen saturations were monitored continuously. The                            Colonoscope was introduced through the anus and                            advanced to the the terminal ileum, with                            identification of the appendiceal orifice and IC                            valve. A second forward view of the right colon was                            performed. The colonoscopy was performed without                            difficulty. The patient tolerated the procedure                            well. The quality of the bowel preparation was                            good. The terminal ileum, ileocecal valve,                            appendiceal orifice, and rectum were photographed. Scope In: 10:59:35 AM Scope Out: 11:16:00 AM Scope Withdrawal Time: 0 hours 13 minutes 10 seconds  Total Procedure Duration: 0 hours 16 minutes 25 seconds  Findings:                 The perianal and digital rectal examinations were                            normal.  A 1 mm polyp was found in the appendiceal orifice.                            The polyp was sessile. The polyp was removed with a                            cold biopsy forceps. Resection and retrieval were                            complete. Estimated blood loss was minimal.                           A 3 mm polyp was found in the hepatic flexure. The                            polyp was sessile. The polyp was removed with a                            cold snare. Resection and retrieval were complete.                            Estimated blood loss was minimal.                           A patchy area of mildly erythematous mucosa was                            found in the proximal transverse colon, at the                            hepatic flexure and in the ascending colon. These                            findings are very mild and are of unclear clinical                             significance. Biopsies were taken with a cold                            forceps for histology. Estimated blood loss was                            minimal.                           The remainder of the examined colon appeared                            normal. Biopsies were taken with a cold forceps for                            histology from the distal transverse and left  colon. Estimated blood loss was minimal.                           Non-bleeding internal hemorrhoids were found. The                            hemorrhoids were small.                           The exam was otherwise without abnormality on                            direct and retroflexion views. Complications:            No immediate complications. Estimated blood loss:                            Minimal. Estimated Blood Loss:     Estimated blood loss was minimal. Impression:               - One 1 mm polyp at the appendiceal orifice,                            removed with a cold biopsy forceps. Resected and                            retrieved.                           - One 3 mm polyp at the hepatic flexure, removed                            with a cold snare. Resected and retrieved.                           - Erythematous mucosa in the proximal transverse                            colon, at the hepatic flexure and in the ascending                            colon. Biopsied.                           - The entire examined colon is normal. Biopsied.                           - Non-bleeding internal hemorrhoids.                           - The examination was otherwise normal on direct                            and retroflexion views. Recommendation:           - Patient has a contact number available for  emergencies. The signs and symptoms of potential                            delayed complications were discussed with the                             patient. Return to normal activities tomorrow.                            Written discharge instructions were provided to the                            patient.                           - Resume regular diet today.                           - Continue present medications.                           - Await pathology results.                           - Repeat colonoscopy in 7 years for surveillance if                            at least one polyp is an adenoma. Thornton Park MD, MD 03/27/2019 11:25:26 AM This report has been signed electronically.

## 2019-03-27 NOTE — Progress Notes (Signed)
Pt's states no medical or surgical changes since previsit or office visit.  Temp per [employer] per Bethena Roys

## 2019-03-27 NOTE — Progress Notes (Signed)
Called to room to assist during endoscopic procedure.  Patient ID and intended procedure confirmed with present staff. Received instructions for my participation in the procedure from the performing physician.  

## 2019-03-31 ENCOUNTER — Telehealth: Payer: Self-pay

## 2019-03-31 NOTE — Telephone Encounter (Signed)
  Follow up Call-  Call back number 03/27/2019  Post procedure Call Back phone  # [phone]  Permission to leave phone message Yes  Some recent data might be hidden     Patient questions:  Do you have a fever, pain , or abdominal swelling? No. Pain Score  0 *  Have you tolerated food without any problems? Yes.    Have you been able to return to your normal activities? Yes.    Do you have any questions about your discharge instructions: Diet   No. Medications  No. Follow up visit  No.  Do you have questions or concerns about your Care? No.  Actions: * If pain score is 4 or above: No action needed, pain <4. 1. Have you developed a fever since your procedure? no  2.   Have you had an respiratory symptoms (SOB or cough) since your procedure? no  3.   Have you tested positive for COVID 19 since your procedure no  4.   Have you had any family members/close contacts diagnosed with the COVID 19 since your procedure?  no   If yes to any of these questions please route to Joylene John, RN and Alphonsa Gin, Therapist, sports.

## 2019-04-06 ENCOUNTER — Other Ambulatory Visit: Payer: Self-pay | Admitting: Nurse Practitioner

## 2019-04-06 DIAGNOSIS — I10 Essential (primary) hypertension: Secondary | ICD-10-CM

## 2019-04-14 ENCOUNTER — Encounter: Payer: Self-pay | Admitting: Gastroenterology

## 2019-05-19 IMAGING — CT CT HEART SCORING
2 series · 16 of 20 positions shown, 18 images · non-contrast
Comparison: None.

CLINICAL DATA: Risk stratification

EXAM:
Coronary Calcium Score
TECHNIQUE: The patient was scanned on a Siemens Somatom 64 slice scanner. Axial
non-contrast 3 mm slices were carried out through the heart. The
data set was analyzed on a dedicated work station and scored using
the Agatson method.

[Series 2: casc 3.0 i36f 2 bestdiast 67 % · axial · 0.40mm/px · z∈[-257,-164]mm · 8 of 41 slices shown, 10 images]
[im 5/41  vessel]
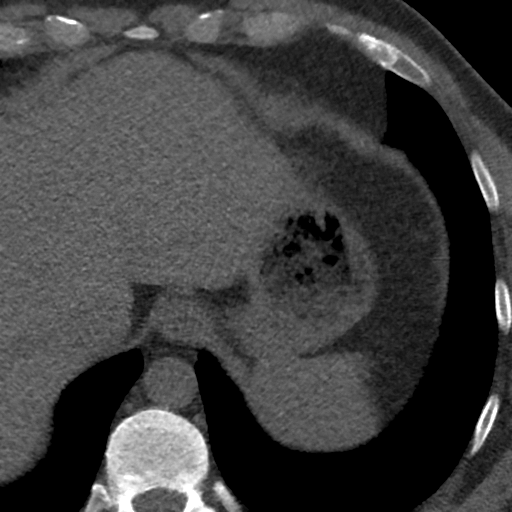
[im 5/41  lung]
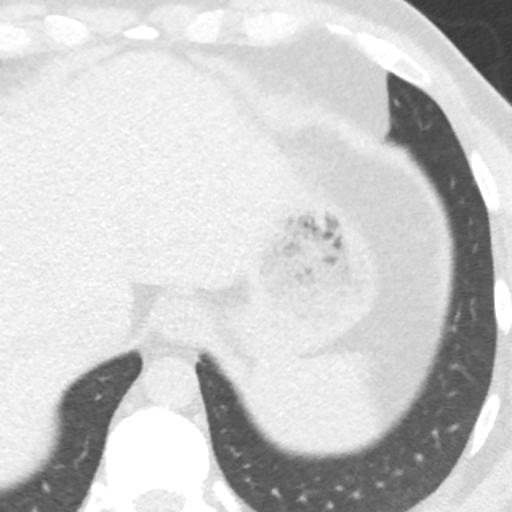
[im 9/41  vessel]
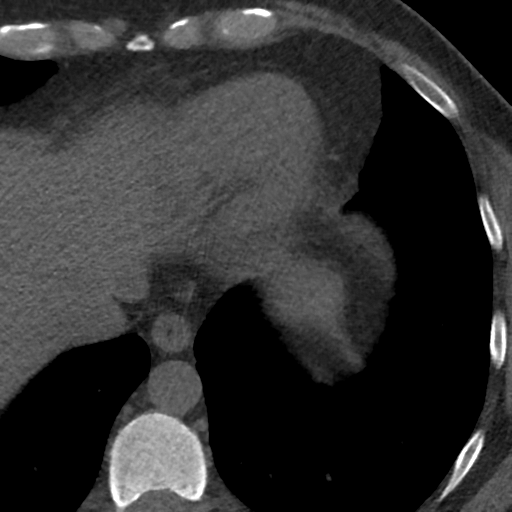
[im 14/41  vessel]
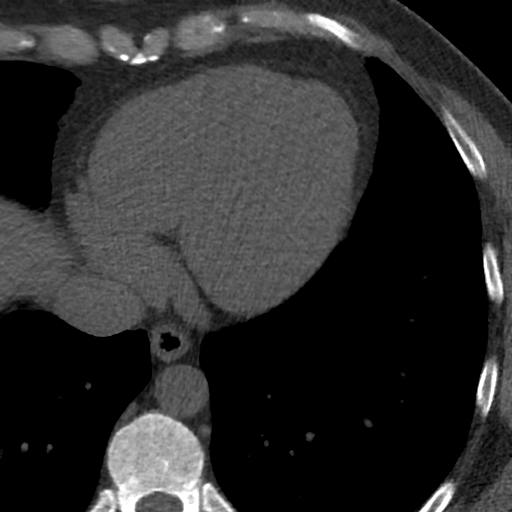
[im 18/41  vessel]
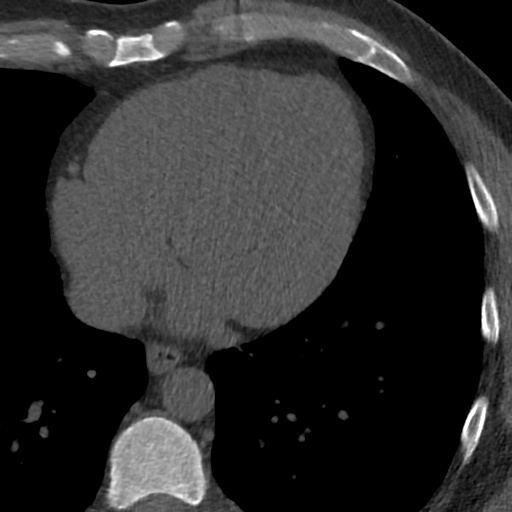
[im 23/41  vessel]
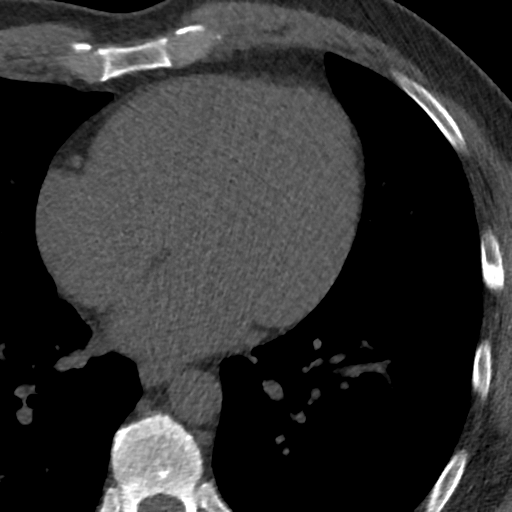
[im 23/41  lung]
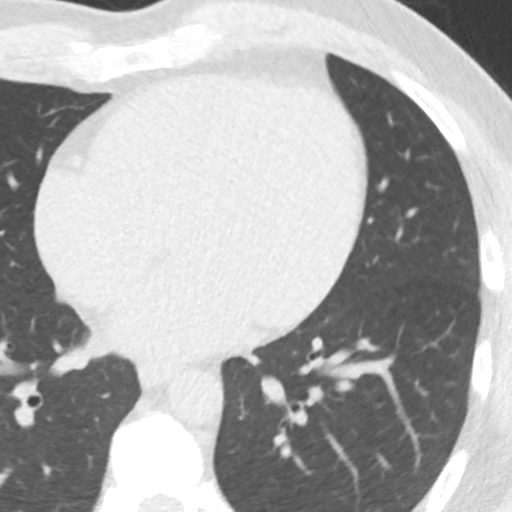
[im 27/41  vessel]
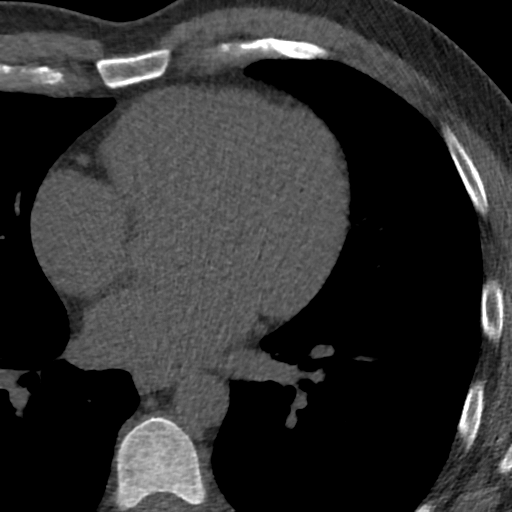
[im 32/41  vessel]
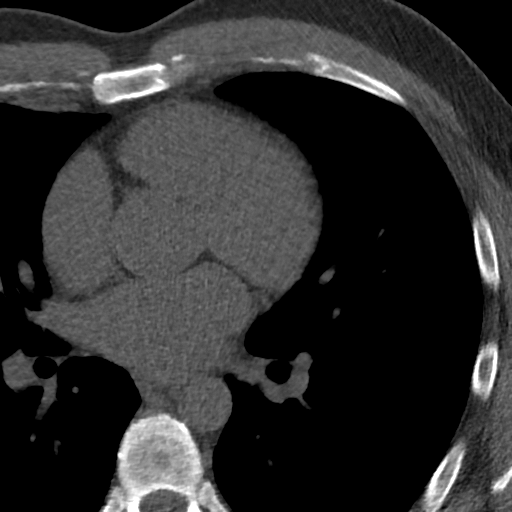
[im 36/41  vessel]
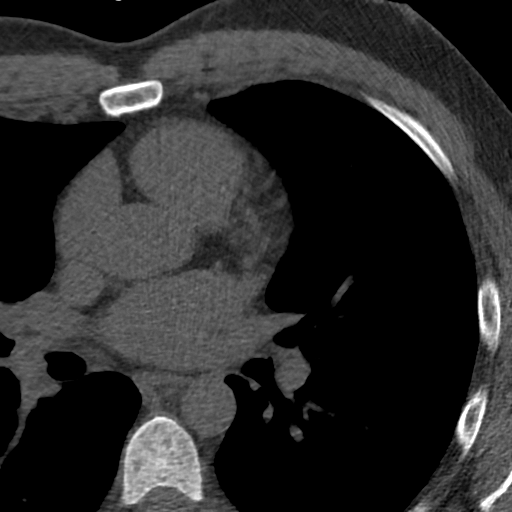

[Series 4: lung st 66 % · axial · 0.72mm/px · z∈[-256,-164]mm · 8 of 41 slices shown]
[im 5/41  lung]
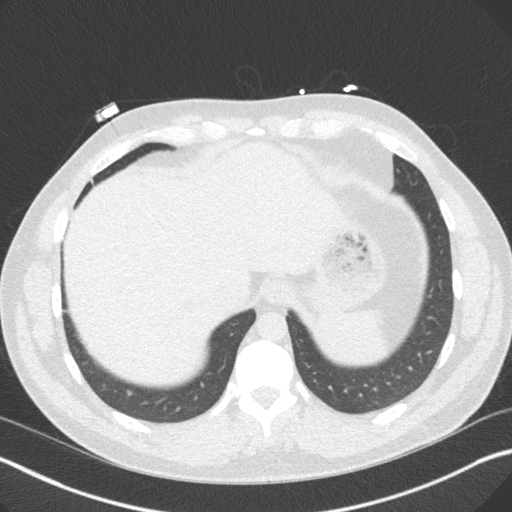
[im 9/41  lung]
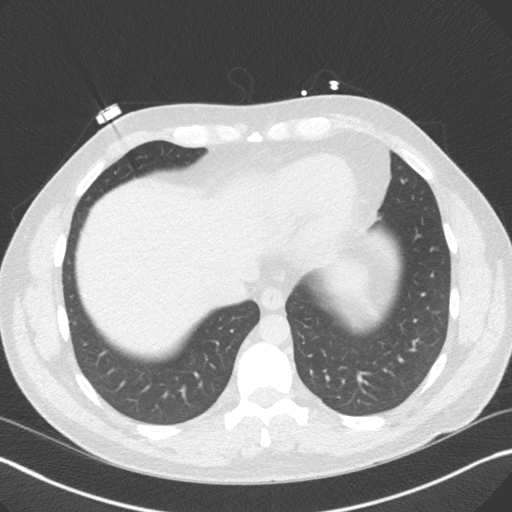
[im 14/41  lung]
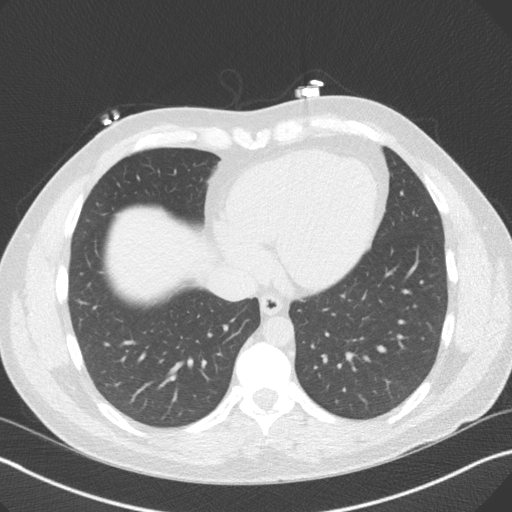
[im 18/41  lung]
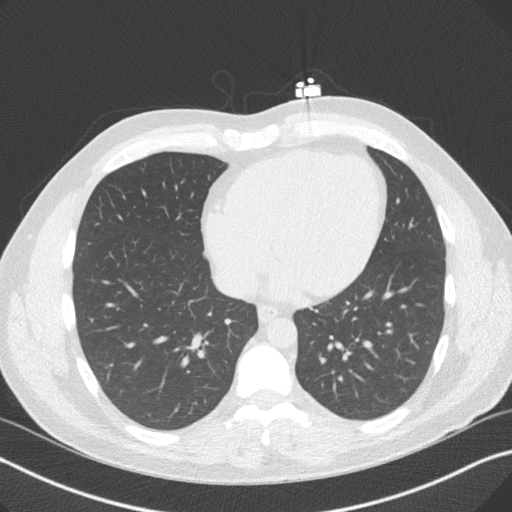
[im 23/41  lung]
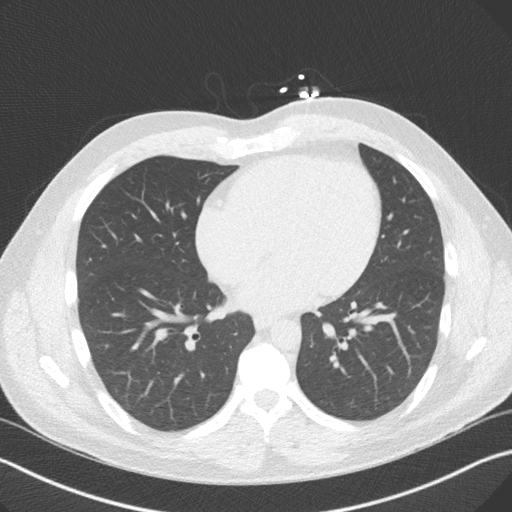
[im 27/41  lung]
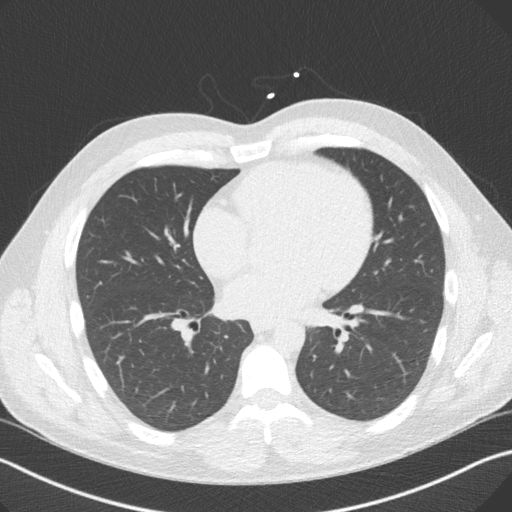
[im 32/41  lung]
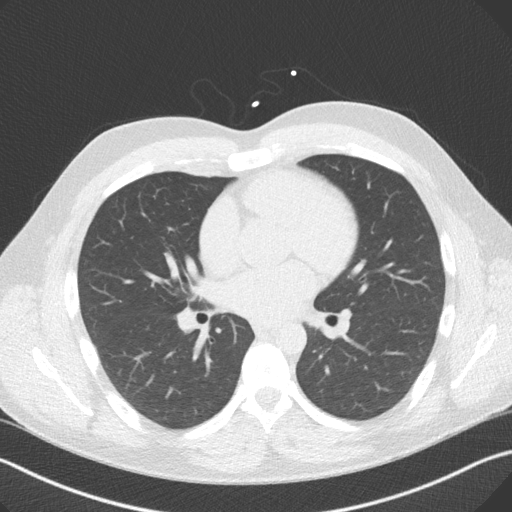
[im 36/41  lung]
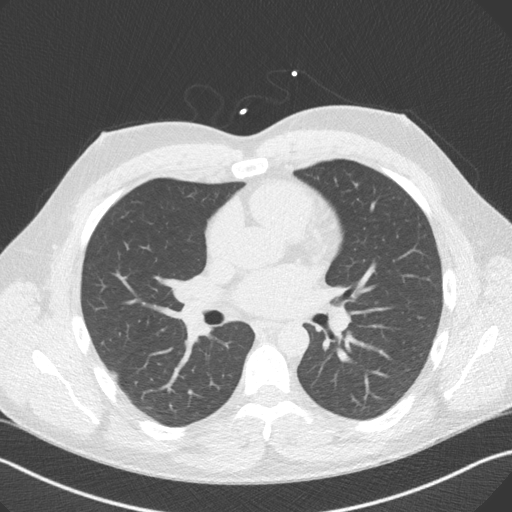

[16 of 20 positions shown; findings below may reference images not displayed]

FINDINGS: Non-cardiac: See separate report from [REDACTED].

Ascending Aorta:  3.1 cm

Pericardium: Normal

Coronary arteries:  No calcium detected
IMPRESSION: Coronary calcium score of 0.

Gavino Powe

EXAM:
OVER-READ INTERPRETATION  CT CHEST

The following report is an over-read performed by radiologist Dr.
Ha Med Eya [REDACTED] on 03/13/2017. This over-read
does not include interpretation of cardiac or coronary anatomy or
pathology. The coronary calcium score interpretation by the
cardiologist is attached.
FINDINGS: Cardiovascular: Heart is normal size. Visualized aorta normal
caliber.

Mediastinum/Nodes: No adenopathy in the visualized lower mediastinum
or hila. Visualized esophagus unremarkable.

Lungs/Pleura: Visualized lungs clear.  No effusions.

Upper Abdomen: No acute findings in the visualized upper abdomen.

Musculoskeletal: Chest wall soft tissues and visualized bony
structures unremarkable.
IMPRESSION: No acute or significant extracardiac abnormality.

## 2019-06-22 ENCOUNTER — Other Ambulatory Visit: Payer: Self-pay

## 2019-06-22 DIAGNOSIS — Z20822 Contact with and (suspected) exposure to covid-19: Secondary | ICD-10-CM

## 2019-06-24 LAB — NOVEL CORONAVIRUS, NAA: SARS-CoV-2, NAA: NOT DETECTED

## 2019-07-21 ENCOUNTER — Other Ambulatory Visit: Payer: Self-pay

## 2019-07-21 DIAGNOSIS — Z20822 Contact with and (suspected) exposure to covid-19: Secondary | ICD-10-CM

## 2019-07-23 LAB — NOVEL CORONAVIRUS, NAA: SARS-CoV-2, NAA: NOT DETECTED

## 2019-07-24 ENCOUNTER — Telehealth: Payer: Self-pay | Admitting: *Deleted

## 2019-07-24 NOTE — Telephone Encounter (Signed)
Patient called and was given NEGATIVE COVID results . 

## 2019-12-18 ENCOUNTER — Ambulatory Visit: Payer: 59

## 2019-12-19 ENCOUNTER — Ambulatory Visit: Payer: 59 | Attending: Internal Medicine

## 2019-12-19 DIAGNOSIS — Z23 Encounter for immunization: Secondary | ICD-10-CM

## 2019-12-19 NOTE — Progress Notes (Signed)
   [id number]  Name:  Roy Bauer.    MRN: [id number] DOB: 01/28/1971  12/19/2019  [name] was observed post Covid-19 immunization for 15 minutes without incident. He was provided with Vaccine Information Sheet and instruction to access the V-Safe system.   [name] was instructed to call 911 with any severe reactions post vaccine: Marland Kitchen Difficulty breathing  . Swelling of face and throat  . A fast heartbeat  . A bad rash all over body  . Dizziness and weakness   Immunizations Administered    Name Date Dose VIS Date Route   Pfizer COVID-19 Vaccine 12/19/2019  2:32 PM 0.3 mL 08/14/2019 Intramuscular   Manufacturer: [place]   Lot: [id number]   [place]: [id number]

## 2020-01-11 ENCOUNTER — Ambulatory Visit: Payer: 59 | Attending: Internal Medicine

## 2020-01-11 DIAGNOSIS — Z23 Encounter for immunization: Secondary | ICD-10-CM

## 2020-01-11 NOTE — Progress Notes (Signed)
   [id number] [facility]  Name:  Roy Bauer.    MRN: [id number] DOB: 31-May-1971  01/11/2020  Roy Bauer was observed post Covid-19 immunization for 15 minutes without incident. He was provided with Vaccine Information Sheet and instruction to access the V-Safe system.   Roy Bauer was instructed to call 911 with any severe reactions post vaccine: Marland Kitchen Difficulty breathing  . Swelling of face and throat  . A fast heartbeat  . A bad rash all over body  . Dizziness and weakness   Immunizations Administered    Name Date Dose VIS Date Route   Pfizer COVID-19 Vaccine 01/11/2020 10:11 AM 0.3 mL 10/28/2018 Intramuscular   Manufacturer: [place]   Lot: [id number]   [place]: [id number]

## 2020-02-18 ENCOUNTER — Encounter: Payer: Self-pay | Admitting: Nurse Practitioner

## 2020-02-25 ENCOUNTER — Encounter: Payer: Self-pay | Admitting: Nurse Practitioner

## 2020-03-27 ENCOUNTER — Other Ambulatory Visit: Payer: Self-pay | Admitting: Nurse Practitioner

## 2020-03-27 DIAGNOSIS — I10 Essential (primary) hypertension: Secondary | ICD-10-CM

## 2020-06-19 ENCOUNTER — Other Ambulatory Visit: Payer: Self-pay | Admitting: Nurse Practitioner

## 2020-06-19 DIAGNOSIS — I10 Essential (primary) hypertension: Secondary | ICD-10-CM

## 2020-06-20 ENCOUNTER — Telehealth: Payer: Self-pay | Admitting: Nurse Practitioner

## 2020-06-20 DIAGNOSIS — I10 Essential (primary) hypertension: Secondary | ICD-10-CM

## 2020-06-20 NOTE — Telephone Encounter (Signed)
Patient called and stated that his medication for lisinopril was denied and he doesn't have an appointment for his cpe until 11/12 but will run out of medication by then, please advise. CB is [phone]

## 2020-06-20 NOTE — Telephone Encounter (Signed)
Please advise 

## 2020-06-21 MED ORDER — LISINOPRIL 10 MG PO TABS: 10.0000 mg | ORAL_TABLET | Freq: Every day | ORAL | 0 refills | Status: DC

## 2020-06-21 NOTE — Telephone Encounter (Signed)
Lab result note from last year indicated he was to call office with BP readings prior to medication refill. That did not happen. His last medication refill in July 2021 also notified him he was to schedule an appt prior to runing out of medications. Since he schedule an appt yesterday for 07/15/2020, I sent 30tabs for now. He should maintain upcoming appt in order to get additional refills. He is to maintain appts with me every 61months for HTN management.

## 2020-06-21 NOTE — Telephone Encounter (Signed)
Lab result note from last year indicated he was to call office with BP readings prior to medication refill. That did not happen. His last medication refill in July 2021 also notified him he was to schedule an appt prior to runing out of medications. Since he schedule an appt yesterday for 07/15/2020, I sent 30tabs for now. He should maintain upcoming appt in order to get additional refills. He is to maintain appts with me every 32months for HTN management.

## 2020-07-15 ENCOUNTER — Encounter: Payer: 59 | Admitting: Nurse Practitioner

## 2020-07-23 ENCOUNTER — Other Ambulatory Visit: Payer: Self-pay | Admitting: Nurse Practitioner

## 2020-07-23 DIAGNOSIS — I10 Essential (primary) hypertension: Secondary | ICD-10-CM

## 2020-07-25 NOTE — Telephone Encounter (Signed)
Last OV 02/13/19   Last fill 06/21/20/#30/0

## 2020-08-01 ENCOUNTER — Encounter: Payer: Self-pay | Admitting: Nurse Practitioner

## 2020-08-01 ENCOUNTER — Ambulatory Visit (INDEPENDENT_AMBULATORY_CARE_PROVIDER_SITE_OTHER): Payer: 59 | Admitting: Nurse Practitioner

## 2020-08-01 ENCOUNTER — Other Ambulatory Visit: Payer: Self-pay

## 2020-08-01 VITALS — BP 122/82 | HR 76 | Temp 97.0°F | Ht 70.0 in | Wt 216.2 lb

## 2020-08-01 DIAGNOSIS — Z13220 Encounter for screening for lipoid disorders: Secondary | ICD-10-CM | POA: Diagnosis not present

## 2020-08-01 DIAGNOSIS — Z125 Encounter for screening for malignant neoplasm of prostate: Secondary | ICD-10-CM

## 2020-08-01 DIAGNOSIS — Z0000 Encounter for general adult medical examination without abnormal findings: Secondary | ICD-10-CM

## 2020-08-01 DIAGNOSIS — N529 Male erectile dysfunction, unspecified: Secondary | ICD-10-CM

## 2020-08-01 DIAGNOSIS — Z1211 Encounter for screening for malignant neoplasm of colon: Secondary | ICD-10-CM | POA: Diagnosis not present

## 2020-08-01 DIAGNOSIS — I10 Essential (primary) hypertension: Secondary | ICD-10-CM

## 2020-08-01 DIAGNOSIS — Z136 Encounter for screening for cardiovascular disorders: Secondary | ICD-10-CM

## 2020-08-01 DIAGNOSIS — Z23 Encounter for immunization: Secondary | ICD-10-CM

## 2020-08-01 DIAGNOSIS — Z0001 Encounter for general adult medical examination with abnormal findings: Secondary | ICD-10-CM

## 2020-08-01 LAB — IFOBT (OCCULT BLOOD): IFOBT: NEGATIVE

## 2020-08-01 MED ORDER — LISINOPRIL 10 MG PO TABS: 10.0000 mg | ORAL_TABLET | Freq: Every day | ORAL | 3 refills | Status: DC

## 2020-08-01 MED ORDER — SILDENAFIL CITRATE 50 MG PO TABS: 50.0000 mg | ORAL_TABLET | Freq: Every day | ORAL | 0 refills | Status: DC | PRN

## 2020-08-01 NOTE — Patient Instructions (Addendum)
Go to [address] for blood draw  you need to be fasting at least 6-8hrs prior to blood draw.  Resume heart healthy diet and regular exercise to promote weight loss. Expected weight 150-180Lbs for your height.  Bring copy of living will and HCPOA  Preventive Care 40-51 Years Old, Male Preventive care refers to lifestyle choices and visits with your health care provider that can promote health and wellness. This includes:  A yearly physical exam. This is also called an annual well check.  Regular dental and eye exams.  Immunizations.  Screening for certain conditions.  Healthy lifestyle choices, such as eating a healthy diet, getting regular exercise, not using drugs or products that contain nicotine and tobacco, and limiting alcohol use. What can I expect for my preventive care visit? Physical exam Your health care provider will check:  Height and weight. These may be used to calculate body mass index (BMI), which is a measurement that tells if you are at a healthy weight.  Heart rate and blood pressure.  Your skin for abnormal spots. Counseling Your health care provider may ask you questions about:  Alcohol, tobacco, and drug use.  Emotional well-being.  Home and relationship well-being.  Sexual activity.  Eating habits.  Work and work environment. What immunizations do I need?  Influenza (flu) vaccine  This is recommended every year. Tetanus, diphtheria, and pertussis (Tdap) vaccine  You may need a Td booster every 10 years. Varicella (chickenpox) vaccine  You may need this vaccine if you have not already been vaccinated. Zoster (shingles) vaccine  You may need this after age 60. Measles, mumps, and rubella (MMR) vaccine  You may need at least one dose of MMR if you were born in 1957 or later. You may also need a second dose. Pneumococcal conjugate (PCV13) vaccine  You may need this if you have certain conditions and were not previously  vaccinated. Pneumococcal polysaccharide (PPSV23) vaccine  You may need one or two doses if you smoke cigarettes or if you have certain conditions. Meningococcal conjugate (MenACWY) vaccine  You may need this if you have certain conditions. Hepatitis A vaccine  You may need this if you have certain conditions or if you travel or work in places where you may be exposed to hepatitis A. Hepatitis B vaccine  You may need this if you have certain conditions or if you travel or work in places where you may be exposed to hepatitis B. Haemophilus influenzae type b (Hib) vaccine  You may need this if you have certain risk factors. Human papillomavirus (HPV) vaccine  If recommended by your health care provider, you may need three doses over 6 months. You may receive vaccines as individual doses or as more than one vaccine together in one shot (combination vaccines). Talk with your health care provider about the risks and benefits of combination vaccines. What tests do I need? Blood tests  Lipid and cholesterol levels. These may be checked every 5 years, or more frequently if you are over 50 years old.  Hepatitis C test.  Hepatitis B test. Screening  Lung cancer screening. You may have this screening every year starting at age 55 if you have a 30-pack-year history of smoking and currently smoke or have quit within the past 15 years.  Prostate cancer screening. Recommendations will vary depending on your family history and other risks.  Colorectal cancer screening. All adults should have this screening starting at age 50 and continuing until age 75. Your   health care provider may recommend screening at age 70 if you are at increased risk. You will have tests every 1-10 years, depending on your results and the type of screening test.  Diabetes screening. This is done by checking your blood sugar (glucose) after you have not eaten for a while (fasting). You may have this done every 1-3  years.  Sexually transmitted disease (STD) testing. Follow these instructions at home: Eating and drinking  Eat a diet that includes fresh fruits and vegetables, whole grains, lean protein, and low-fat dairy products.  Take vitamin and mineral supplements as recommended by your health care provider.  Do not drink alcohol if your health care provider tells you not to drink.  If you drink alcohol: ? Limit how much you have to 0-2 drinks a day. ? Be aware of how much alcohol is in your drink. In the [place] one drink equals one 12 oz bottle of beer (355 mL), one 5 oz glass of wine (148 mL), or one 1 oz glass of hard liquor (44 mL). Lifestyle  Take daily care of your teeth and gums.  Stay active. Exercise for at least 30 minutes on 5 or more days each week.  Do not use any products that contain nicotine or tobacco, such as cigarettes, e-cigarettes, and chewing tobacco. If you need help quitting, ask your health care provider.  If you are sexually active, practice safe sex. Use a condom or other form of protection to prevent STIs (sexually transmitted infections).  Talk with your health care provider about taking a low-dose aspirin every day starting at age 43. What's next?  Go to your health care provider once a year for a well check visit.  Ask your health care provider how often you should have your eyes and teeth checked.  Stay up to date on all vaccines. This information is not intended to replace advice given to you by your health care provider. Make sure you discuss any questions you have with your health care provider. Document Revised: 08/14/2018 Document Reviewed: 08/14/2018 Elsevier Patient Education  2020 [employer].

## 2020-08-01 NOTE — Assessment & Plan Note (Signed)
Stable BP with lisinopril BP Readings from Last 3 Encounters:  08/01/20 122/82  03/27/19 126/88  02/13/19 140/82   Maintain current medication

## 2020-08-01 NOTE — Progress Notes (Signed)
Subjective:    Patient ID: Roy Regal., male    DOB: 11/07/1970, 49 y.o.   MRN: [id number]  Patient presents today for CPE and r eval of chronic conditions  HPI HTN (hypertension) Stable BP with lisinopril BP Readings from Last 3 Encounters:  08/01/20 122/82  03/27/19 126/88  02/13/19 140/82   Maintain current medication   Sexual History (orientation,birth control, marital status, STD):married, sexually active, reports occasional difficulty with an erection and urgency, will like Viagra rx. Denies any nocturia/hematuria/freduency.  Depression/Suicide: Depression screen [facility] 2/9 08/01/2020 02/13/2019 03/14/2018 02/05/2017  Decreased Interest 0 0 0 0  Down, Depressed, Hopeless 0 0 0 0  PHQ - 2 Score 0 0 0 0  Altered sleeping 1 - - -  Tired, decreased energy 0 - - -  Change in appetite 0 - - -  Feeling bad or failure about yourself  0 - - -  Trouble concentrating 0 - - -  Moving slowly or fidgety/restless 0 - - -  Suicidal thoughts 0 - - -  PHQ-9 Score 1 - - -  Difficult doing work/chores Not difficult at all - - -   Vision:up to date  Dental:up to date  Immunizations: (TDAP, Hep C screen, Pneumovax, Influenza, zoster)  Health Maintenance  Topic Date Due  .  Hepatitis C: One time screening is recommended by Center for Disease Control  (CDC) for  adults born from [birth year] through 1967.   08/01/2021*  . HIV Screening  08/01/2021*  . Colon Cancer Screening  03/26/2026  . Tetanus Vaccine  03/14/2028  . Flu Shot  Completed  . COVID-19 Vaccine  Completed  *Topic was postponed. The date shown is not the original due date.   Diet:regular.  Weight:  Wt Readings from Last 3 Encounters:  08/01/20 216 lb 3.2 oz (98.1 kg)  03/27/19 180 lb (81.6 kg)  03/13/19 180 lb (81.6 kg)   Fall Risk: Fall Risk  08/01/2020 02/13/2019 03/14/2018 02/05/2017  Falls in the past year? 0 0 No No  Number falls in past yr: 0 - - -  Injury with Fall? 0 - - -   Medications and allergies reviewed  with patient and updated if appropriate.  Patient Active Problem List   Diagnosis Date Noted  . Erectile dysfunction 08/01/2020  . Abnormal EKG 03/07/2017  . 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 Transient vision disturbance of right eye 02/19/2017  . HTN (hypertension) 02/05/2017    No current outpatient medications on file prior to visit.   No current facility-administered medications on file prior to visit.    Past Medical History:  Diagnosis Date  . Hernia, abdominal   . Hypertension     Past Surgical History:  Procedure Laterality Date  . None      Social History   Socioeconomic History  . Marital status: Married    Spouse name: Not on file  . Number of children: 3  . Years of education: Not on file  . Highest education level: Not on file  Occupational History  . Not on file  Tobacco Use  . Smoking status: Never Smoker  . Smokeless tobacco: Never Used  Vaping Use  . Vaping Use: Never used  Substance and Sexual Activity  . Alcohol use: Yes    Comment: social  . Drug use: No  . Sexual activity: Yes    Birth control/protection: None  Other Topics Concern  . Not on file  Social History Narrative  . Not on  file   Social Determinants of Health   Financial Resource Strain:   . Difficulty of Paying Living Expenses: Not on file  Food Insecurity:   . Worried About Charity fundraiser in the Last Year: Not on file  . Ran Out of Food in the Last Year: Not on file  Transportation Needs:   . Lack of Transportation (Medical): Not on file  . Lack of Transportation (Non-Medical): Not on file  Physical Activity:   . Days of Exercise per Week: Not on file  . Minutes of Exercise per Session: Not on file  Stress:   . Feeling of Stress : Not on file  Social Connections:   . Frequency of Communication with Friends and Family: Not on file  . Frequency of Social Gatherings with Friends and Family: Not on file  . Attends Religious Services: Not on file  .  Active Member of Clubs or Organizations: Not on file  . Attends Archivist Meetings: Not on file  . Marital Status: Not on file    Family History  Problem Relation Age of Onset  . Heart disease Mother        Valve surgery for SBE  . Benign prostatic hyperplasia Father   . Aneurysm Maternal Grandmother   . Alcohol abuse Paternal Grandfather   . COPD Paternal Grandfather   . Colon cancer Neg Hx   . Esophageal cancer Neg Hx   . Rectal cancer Neg Hx   . Stomach cancer Neg Hx         Review of Systems  Constitutional: Negative for fever, malaise/fatigue and weight loss.  HENT: Negative for congestion and sore throat.   Eyes:       Negative for visual changes  Respiratory: Negative for cough and shortness of breath.   Cardiovascular: Negative for chest pain, palpitations and leg swelling.  Gastrointestinal: Negative for blood in stool, constipation, diarrhea and heartburn.  Genitourinary: Positive for urgency. Negative for dysuria, flank pain, frequency and hematuria.  Musculoskeletal: Negative for falls, joint pain and myalgias.  Skin: Negative for rash.  Neurological: Negative for dizziness, sensory change and headaches.  Endo/Heme/Allergies: Does not bruise/bleed easily.  Psychiatric/Behavioral: Negative for depression, substance abuse and suicidal ideas. The patient is not nervous/anxious.     Objective:   Vitals:   08/01/20 1049  BP: 122/82  Pulse: 76  Temp: (!) 97 F (36.1 C)  SpO2: 97%    Body mass index is 31.02 kg/m.   Physical Examination:  Physical Exam Vitals and nursing note reviewed. Exam conducted with a chaperone present.  Constitutional:      General: He is not in acute distress.    Appearance: He is well-developed. He is obese.  HENT:     Right Ear: Tympanic membrane, ear canal and external ear normal.     Left Ear: Tympanic membrane, ear canal and external ear normal.  Eyes:     Extraocular Movements: Extraocular movements intact.       Conjunctiva/sclera: Conjunctivae normal.  Cardiovascular:     Rate and Rhythm: Normal rate and regular rhythm.     Pulses: Normal pulses.     Heart sounds: Normal heart sounds.  Pulmonary:     Effort: Pulmonary effort is normal. No respiratory distress.     Breath sounds: Normal breath sounds.  Chest:     Chest wall: No tenderness.  Abdominal:     General: Bowel sounds are normal.     Palpations: Abdomen is soft.  Genitourinary:    Prostate: Enlarged. Not tender and no nodules present.     Rectum: Normal. Guaiac result negative.  Musculoskeletal:        General: Normal range of motion.     Cervical back: Normal range of motion and neck supple.     Right lower leg: No edema.     Left lower leg: No edema.  Skin:    General: Skin is warm and dry.  Neurological:     Mental Status: He is alert and oriented to person, place, and time.     Deep Tendon Reflexes: Reflexes are normal and symmetric.  Psychiatric:        Mood and Affect: Mood normal.        Behavior: Behavior normal.    ASSESSMENT and PLAN: This visit occurred during the SARS-CoV-2 public health emergency.  Safety protocols were in place, including screening questions prior to the visit, additional usage of staff PPE, and extensive cleaning of exam room while observing appropriate contact time as indicated for disinfecting solutions.   Damyen was seen today for annual exam.  Diagnoses and all orders for this visit:  Encounter for preventative adult health care exam with abnormal findings -     Cancel: PSA; Future -     IFOBT POC (occult bld, rslt in office) -     Cancel: CBC; Future -     Cancel: Comprehensive metabolic panel; Future -     Cancel: Lipid panel; Future -     CBC; Future -     Comprehensive metabolic panel; Future -     Lipid panel; Future -     PSA; Future  Influenza vaccine needed -     Flu Vaccine QUAD 6+ mos PF IM (Fluarix Quad PF)  Encounter for lipid screening for cardiovascular  disease -     Lipid panel; Future  Prostate cancer screening -     Cancel: PSA; Future -     PSA; Future  Encounter for screening fecal occult blood testing -     IFOBT POC (occult bld, rslt in office)  Erectile dysfunction, unspecified erectile dysfunction type -     sildenafil (VIAGRA) 50 MG tablet; Take 1 tablet (50 mg total) by mouth daily as needed for erectile dysfunction.  Primary hypertension  Essential hypertension -     lisinopril (ZESTRIL) 10 MG tablet; Take 1 tablet (10 mg total) by mouth daily.        Problem List Items Addressed This Visit      Cardiovascular and Mediastinum   HTN (hypertension)    Stable BP with lisinopril BP Readings from Last 3 Encounters:  08/01/20 122/82  03/27/19 126/88  02/13/19 140/82   Maintain current medication      Relevant Medications   sildenafil (VIAGRA) 50 MG tablet   lisinopril (ZESTRIL) 10 MG tablet     Other   Erectile dysfunction   Relevant Medications   sildenafil (VIAGRA) 50 MG tablet    Other Visit Diagnoses    Encounter for preventative adult health care exam with abnormal findings    -  Primary   Relevant Orders   IFOBT POC (occult bld, rslt in office) (Completed)   CBC   Comprehensive metabolic panel   Lipid panel   PSA   Influenza vaccine needed       Relevant Orders   Flu Vaccine QUAD 6+ mos PF IM (Fluarix Quad PF) (Completed)   Encounter for lipid screening for cardiovascular disease  Relevant Orders   Lipid panel   Prostate cancer screening       Relevant Orders   PSA   Encounter for screening fecal occult blood testing       Relevant Orders   IFOBT POC (occult bld, rslt in office) (Completed)   Essential hypertension       Relevant Medications   sildenafil (VIAGRA) 50 MG tablet   lisinopril (ZESTRIL) 10 MG tablet      Follow up: Return in about 6 months (around 01/29/2021) for HTN (F2F or video).  Roy Lacy, NP

## 2021-01-30 ENCOUNTER — Ambulatory Visit: Payer: 59 | Admitting: Nurse Practitioner

## 2021-01-31 ENCOUNTER — Other Ambulatory Visit: Payer: Self-pay

## 2021-01-31 ENCOUNTER — Encounter: Payer: Self-pay | Admitting: Nurse Practitioner

## 2021-01-31 ENCOUNTER — Ambulatory Visit (INDEPENDENT_AMBULATORY_CARE_PROVIDER_SITE_OTHER): Payer: 59 | Admitting: Nurse Practitioner

## 2021-01-31 VITALS — BP 126/86 | HR 60 | Temp 97.8°F | Ht 70.0 in | Wt 207.0 lb

## 2021-01-31 DIAGNOSIS — N529 Male erectile dysfunction, unspecified: Secondary | ICD-10-CM | POA: Diagnosis not present

## 2021-01-31 DIAGNOSIS — Z0001 Encounter for general adult medical examination with abnormal findings: Secondary | ICD-10-CM

## 2021-01-31 DIAGNOSIS — I10 Essential (primary) hypertension: Secondary | ICD-10-CM

## 2021-01-31 DIAGNOSIS — Z136 Encounter for screening for cardiovascular disorders: Secondary | ICD-10-CM

## 2021-01-31 DIAGNOSIS — Z13220 Encounter for screening for lipoid disorders: Secondary | ICD-10-CM | POA: Diagnosis not present

## 2021-01-31 DIAGNOSIS — Z125 Encounter for screening for malignant neoplasm of prostate: Secondary | ICD-10-CM

## 2021-01-31 LAB — COMPREHENSIVE METABOLIC PANEL
ALT: 19 U/L (ref 0–53)
AST: 21 U/L (ref 0–37)
Albumin: 4.5 g/dL (ref 3.5–5.2)
Alkaline Phosphatase: 43 U/L (ref 39–117)
BUN: 30 mg/dL — ABNORMAL HIGH (ref 6–23)
CO2: 26 mEq/L (ref 19–32)
Calcium: 9.6 mg/dL (ref 8.4–10.5)
Chloride: 105 mEq/L (ref 96–112)
Creatinine, Ser: 1.24 mg/dL (ref 0.40–1.50)
GFR: 67.07 mL/min (ref >60.00)
Glucose, Bld: 100 mg/dL — ABNORMAL HIGH (ref 70–99)
Potassium: 5 mEq/L (ref 3.5–5.1)
Sodium: 140 mEq/L (ref 135–145)
Total Bilirubin: 0.7 mg/dL (ref 0.2–1.2)
Total Protein: 6.6 g/dL (ref 6.0–8.3)

## 2021-01-31 LAB — LIPID PANEL
Cholesterol: 185 mg/dL (ref 0–200)
HDL: 44.8 mg/dL (ref >39.00)
LDL Cholesterol: 122 mg/dL — ABNORMAL HIGH (ref 0–99)
NonHDL: 140.18
Total CHOL/HDL Ratio: 4
Triglycerides: 93 mg/dL (ref 0.0–149.0)
VLDL: 18.6 mg/dL (ref 0.0–40.0)

## 2021-01-31 LAB — PSA: PSA: 0.89 ng/mL (ref 0.10–4.00)

## 2021-01-31 LAB — CBC
HCT: 44.8 % (ref 39.0–52.0)
Hemoglobin: 15 g/dL (ref 13.0–17.0)
MCHC: 33.4 g/dL (ref 30.0–36.0)
MCV: 88.4 fl (ref 78.0–100.0)
Platelets: 291 10*3/uL (ref 150.0–400.0)
RBC: 5.07 Mil/uL (ref 4.22–5.81)
RDW: 13.2 % (ref 11.5–15.5)
WBC: 5 10*3/uL (ref 4.0–10.5)

## 2021-01-31 NOTE — Addendum Note (Signed)
Addended by: Beryle Lathe S on: 01/31/2021 11:01 AM   Modules accepted: Orders

## 2021-01-31 NOTE — Progress Notes (Signed)
   Subjective:  Patient ID: Roy Bauer., male    DOB: Jan 28, 1971  Age: 50 y.o. MRN: [id number]  CC: Follow-up (6 month f/u for HTN)  HPI  HTN (hypertension) BP at goal with lisinopril and lifestyle changes BP Readings from Last 3 Encounters:  01/31/21 126/86  08/01/20 122/82  03/27/19 126/88   Advise to go to lab for blood draw Maintain current medications  Wt Readings from Last 3 Encounters:  01/31/21 207 lb (93.9 kg)  08/01/20 216 lb 3.2 oz (98.1 kg)  03/27/19 180 lb (81.6 kg)   Reviewed past Medical, Social and Family history today.  Outpatient Medications Prior to Visit  Medication Sig Dispense Refill  . lisinopril (ZESTRIL) 10 MG tablet Take 1 tablet (10 mg total) by mouth daily. 90 tablet 3  . sildenafil (VIAGRA) 50 MG tablet Take 1 tablet (50 mg total) by mouth daily as needed for erectile dysfunction. 10 tablet 0   No facility-administered medications prior to visit.    ROS See HPI  Objective:  BP 126/86 (BP Location: Left Arm, Patient Position: Sitting, Cuff Size: Large)   Pulse 60   Temp 97.8 F (36.6 C) (Temporal)   Ht 5\' 10"  (1.778 m)   Wt 207 lb (93.9 kg)   SpO2 99%   BMI 29.70 kg/m   Physical Exam Vitals reviewed.  Cardiovascular:     Rate and Rhythm: Normal rate.     Pulses: Normal pulses.  Pulmonary:     Effort: Pulmonary effort is normal.  Musculoskeletal:     Right lower leg: No edema.     Left lower leg: No edema.  Neurological:     Mental Status: He is alert and oriented to person, place, and time.    Assessment & Plan:  This visit occurred during the SARS-CoV-2 public health emergency.  Safety protocols were in place, including screening questions prior to the visit, additional usage of staff PPE, and extensive cleaning of exam room while observing appropriate contact time as indicated for disinfecting solutions.   Roy Bauer was seen today for follow-up.  Diagnoses and all orders for this visit:  Primary  hypertension   Problem List Items Addressed This Visit      Cardiovascular and Mediastinum   HTN (hypertension) - Primary    BP at goal with lisinopril and lifestyle changes BP Readings from Last 3 Encounters:  01/31/21 126/86  08/01/20 122/82  03/27/19 126/88   Advise to go to lab for blood draw Maintain current medications         Follow-up: Return in about 8 months (around 10/03/2021) for CPE (fasting).  Wilfred Lacy, NP

## 2021-01-31 NOTE — Assessment & Plan Note (Signed)
BP at goal with lisinopril and lifestyle changes BP Readings from Last 3 Encounters:  01/31/21 126/86  08/01/20 122/82  03/27/19 126/88   Advise to go to lab for blood draw Maintain current medications

## 2021-01-31 NOTE — Patient Instructions (Addendum)
Go to lab for blood draw today  maintain current medication.  Keep up the great work with diet and exercise.

## 2021-02-11 ENCOUNTER — Other Ambulatory Visit: Payer: Self-pay | Admitting: Nurse Practitioner

## 2021-02-11 DIAGNOSIS — N529 Male erectile dysfunction, unspecified: Secondary | ICD-10-CM

## 2021-02-13 MED ORDER — SILDENAFIL CITRATE 50 MG PO TABS: 50.0000 mg | ORAL_TABLET | Freq: Every day | ORAL | 0 refills | Status: DC | PRN

## 2021-04-09 ENCOUNTER — Other Ambulatory Visit: Payer: Self-pay | Admitting: Nurse Practitioner

## 2021-04-09 DIAGNOSIS — N529 Male erectile dysfunction, unspecified: Secondary | ICD-10-CM

## 2021-04-11 ENCOUNTER — Other Ambulatory Visit: Payer: Self-pay

## 2021-04-11 MED ORDER — SILDENAFIL CITRATE 50 MG PO TABS
50.0000 mg | ORAL_TABLET | Freq: Every day | ORAL | 0 refills | Status: DC | PRN
  Filled 2021-04-11: qty 20, 60d supply, fill #0

## 2021-04-11 NOTE — Telephone Encounter (Signed)
Refill request for: Viagra 50 mg LR 02/13/21, #10, 0 rf LOV  01/31/21 FOV 08/14/21 Patient wants to know if he can get a stonger dose?  Please review and advise. thanks.  Dm/cma

## 2021-04-12 ENCOUNTER — Other Ambulatory Visit: Payer: Self-pay

## 2021-05-22 ENCOUNTER — Other Ambulatory Visit: Payer: Self-pay | Admitting: Nurse Practitioner

## 2021-05-22 DIAGNOSIS — N529 Male erectile dysfunction, unspecified: Secondary | ICD-10-CM

## 2021-08-02 MED ORDER — SILDENAFIL CITRATE 50 MG PO TABS: ORAL_TABLET | ORAL | 1 refills | Status: DC

## 2021-08-02 NOTE — Telephone Encounter (Signed)
Chart supports rx refill Last ov: 01/31/2021 Last refill: 04/11/2021

## 2021-08-07 ENCOUNTER — Encounter: Payer: 59 | Admitting: Nurse Practitioner

## 2021-08-14 ENCOUNTER — Encounter: Payer: 59 | Admitting: Nurse Practitioner

## 2021-08-15 ENCOUNTER — Other Ambulatory Visit: Payer: Self-pay | Admitting: Nurse Practitioner

## 2021-08-15 DIAGNOSIS — I10 Essential (primary) hypertension: Secondary | ICD-10-CM

## 2021-08-17 NOTE — Telephone Encounter (Signed)
Chart supports Rx Seen 01/2021 Next OV 09/2021

## 2021-12-17 ENCOUNTER — Other Ambulatory Visit: Payer: Self-pay | Admitting: Nurse Practitioner

## 2021-12-17 DIAGNOSIS — N529 Male erectile dysfunction, unspecified: Secondary | ICD-10-CM

## 2022-01-02 ENCOUNTER — Other Ambulatory Visit: Payer: Self-pay | Admitting: Nurse Practitioner

## 2022-01-02 DIAGNOSIS — N529 Male erectile dysfunction, unspecified: Secondary | ICD-10-CM
